# Patient Record
Sex: Female | Born: 1995 | Race: Black or African American | Hispanic: No | Smoking: Former smoker
Health system: Southern US, Community
[De-identification: ages and names within clinical notes are randomized; demographics above are authoritative.]

## PROBLEM LIST (undated history)

## (undated) ENCOUNTER — Inpatient Hospital Stay: Payer: Self-pay

## (undated) DIAGNOSIS — R000 Tachycardia, unspecified: Secondary | ICD-10-CM

## (undated) DIAGNOSIS — F419 Anxiety disorder, unspecified: Secondary | ICD-10-CM

## (undated) DIAGNOSIS — R51 Headache: Secondary | ICD-10-CM

## (undated) DIAGNOSIS — F32A Depression, unspecified: Secondary | ICD-10-CM

## (undated) DIAGNOSIS — T7840XA Allergy, unspecified, initial encounter: Secondary | ICD-10-CM

## (undated) DIAGNOSIS — F329 Major depressive disorder, single episode, unspecified: Secondary | ICD-10-CM

## (undated) DIAGNOSIS — J40 Bronchitis, not specified as acute or chronic: Secondary | ICD-10-CM

## (undated) DIAGNOSIS — J45909 Unspecified asthma, uncomplicated: Secondary | ICD-10-CM

## (undated) DIAGNOSIS — S42009A Fracture of unspecified part of unspecified clavicle, initial encounter for closed fracture: Secondary | ICD-10-CM

## (undated) HISTORY — DX: Anxiety disorder, unspecified: F41.9

## (undated) HISTORY — DX: Bronchitis, not specified as acute or chronic: J40

## (undated) HISTORY — DX: Depression, unspecified: F32.A

## (undated) HISTORY — DX: Headache: R51

## (undated) HISTORY — DX: Fracture of unspecified part of unspecified clavicle, initial encounter for closed fracture: S42.009A

## (undated) HISTORY — DX: Tachycardia, unspecified: R00.0

## (undated) HISTORY — DX: Unspecified asthma, uncomplicated: J45.909

## (undated) HISTORY — DX: Major depressive disorder, single episode, unspecified: F32.9

## (undated) HISTORY — DX: Allergy, unspecified, initial encounter: T78.40XA

## (undated) HISTORY — PX: NO PAST SURGERIES: SHX2092

---
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
## 2017-01-01 ENCOUNTER — Encounter: Payer: Medicaid Other | Admitting: Certified Nurse Midwife

## 2017-01-03 ENCOUNTER — Ambulatory Visit (INDEPENDENT_AMBULATORY_CARE_PROVIDER_SITE_OTHER): Payer: Medicaid Other | Admitting: Certified Nurse Midwife

## 2017-01-03 VITALS — BP 108/70 | HR 99 | Wt 101.0 lb

## 2017-01-03 DIAGNOSIS — Z3492 Encounter for supervision of normal pregnancy, unspecified, second trimester: Secondary | ICD-10-CM

## 2017-01-03 LAB — POCT URINALYSIS DIPSTICK
Bilirubin, UA: NEGATIVE
Blood, UA: NEGATIVE
GLUCOSE UA: NEGATIVE
Ketones, UA: NEGATIVE
LEUKOCYTES UA: NEGATIVE
NITRITE UA: NEGATIVE
SPEC GRAV UA: 1.015 (ref 1.010–1.025)
Urobilinogen, UA: 0.2 E.U./dL
pH, UA: 6 (ref 5.0–8.0)

## 2017-01-03 NOTE — Progress Notes (Signed)
ROB doing well. No complaints. She is feeling movement on occasion. Discussed anatomy u/s @ 20 wks. She verbalizes understanding. Follow up 3 wks   Doreene BurkeAnnie Loys Hoselton, CNM

## 2017-01-03 NOTE — Progress Notes (Signed)
ROB- pt is having migraines everyday, takes tylenol hasnt helped at all, pt has a history of migraines

## 2017-01-03 NOTE — Patient Instructions (Signed)

## 2017-01-05 ENCOUNTER — Encounter: Payer: Medicaid Other | Admitting: Certified Nurse Midwife

## 2017-01-09 ENCOUNTER — Other Ambulatory Visit: Payer: Self-pay | Admitting: Obstetrics and Gynecology

## 2017-01-09 DIAGNOSIS — Z369 Encounter for antenatal screening, unspecified: Secondary | ICD-10-CM

## 2017-01-21 ENCOUNTER — Encounter: Payer: Self-pay | Admitting: Certified Nurse Midwife

## 2017-01-22 ENCOUNTER — Emergency Department: Payer: Medicaid Other

## 2017-01-22 ENCOUNTER — Encounter: Payer: Self-pay | Admitting: Emergency Medicine

## 2017-01-22 ENCOUNTER — Emergency Department
Admission: EM | Admit: 2017-01-22 | Discharge: 2017-01-22 | Disposition: A | Discharge status: Home / Self Care | Payer: Medicaid Other | Attending: Emergency Medicine | Admitting: Emergency Medicine

## 2017-01-22 ENCOUNTER — Other Ambulatory Visit: Payer: Self-pay

## 2017-01-22 ENCOUNTER — Other Ambulatory Visit: Payer: Self-pay | Admitting: Certified Nurse Midwife

## 2017-01-22 DIAGNOSIS — Z79899 Other long term (current) drug therapy: Secondary | ICD-10-CM | POA: Insufficient documentation

## 2017-01-22 DIAGNOSIS — J45909 Unspecified asthma, uncomplicated: Secondary | ICD-10-CM

## 2017-01-22 DIAGNOSIS — R0602 Shortness of breath: Secondary | ICD-10-CM | POA: Diagnosis present

## 2017-01-22 DIAGNOSIS — Z87891 Personal history of nicotine dependence: Secondary | ICD-10-CM | POA: Diagnosis not present

## 2017-01-22 DIAGNOSIS — J45901 Unspecified asthma with (acute) exacerbation: Secondary | ICD-10-CM | POA: Insufficient documentation

## 2017-01-22 DIAGNOSIS — O99512 Diseases of the respiratory system complicating pregnancy, second trimester: Principal | ICD-10-CM

## 2017-01-22 MED ORDER — ALBUTEROL SULFATE (2.5 MG/3ML) 0.083% IN NEBU: 2.5000 mg | INHALATION_SOLUTION | RESPIRATORY_TRACT | 0 refills | Status: DC | PRN

## 2017-01-22 MED ORDER — ALBUTEROL SULFATE (2.5 MG/3ML) 0.083% IN NEBU
2.5000 mg | INHALATION_SOLUTION | Freq: Once | RESPIRATORY_TRACT | Status: AC
  Administered 2017-01-22: 2.5 mg via RESPIRATORY_TRACT
  Filled 2017-01-22: qty 3

## 2017-01-22 NOTE — ED Triage Notes (Signed)
Tightness in chest since yesterday, says she has asthma attack yesterday.

## 2017-01-22 NOTE — ED Notes (Signed)
See triage note states she developed some SOB and wheezing over the past 1-2 days   Min relief with inhalers
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
## 2017-01-22 NOTE — ED Notes (Signed)
FHT 164

## 2017-01-23 NOTE — L&D Delivery Note (Signed)
Delivery Note   Shelby Kelly is a 22 y.o. G1P0000 at [redacted]w[redacted]d Estimated Date of Delivery: 06/13/17  PRE-OPERATIVE DIAGNOSIS:  1) [redacted]w[redacted]d pregnancy.   POST-OPERATIVE DIAGNOSIS:  1) [redacted]w[redacted]d pregnancy s/p Vaginal, Spontaneous   Delivery Type: Vaginal, Spontaneous    Delivery Anesthesia: Epidural   Labor Complications:   none    ESTIMATED BLOOD LOSS: 250 ml    FINDINGS:   1) female infant, Apgar scores of  7 at 1 minute and 8  at 5 minutes and a birthweight of   6 lbs 13 ounces.    2) Nuchal cord: no  SPECIMENS:   PLACENTA:   Appearance: Intact , 3 vessel cord   Removal: Spontaneous      Disposition:   held per protocol the discarded  DISPOSITION:  Infant to left in stable condition in the delivery room, with L&D personnel and mother,  NARRATIVE SUMMARY: Labor course:  Ms. [name] is a G1P0000 at [redacted]w[redacted]d who presented for labor management.  She progressed well in labor with pitocin.  She received the appropriate anesthesia and proceeded to complete dilation. She evidenced good maternal expulsive effort during the second stage. She went on to deliver a viable female infant "[name]". The placenta delivered without problems and was noted to be complete. A bimanual exam was complete with removal of clots  A perineal and vaginal examination was performed. Lacerations:   None. The patient tolerated this well.  Doreene Burke, CNM  06/15/2017 5:58 PM

## 2017-01-24 ENCOUNTER — Ambulatory Visit (INDEPENDENT_AMBULATORY_CARE_PROVIDER_SITE_OTHER): Payer: Medicaid Other | Admitting: Obstetrics and Gynecology

## 2017-01-24 ENCOUNTER — Ambulatory Visit (INDEPENDENT_AMBULATORY_CARE_PROVIDER_SITE_OTHER): Payer: Medicaid Other

## 2017-01-24 VITALS — BP 96/65 | HR 106 | Wt 103.6 lb

## 2017-01-24 DIAGNOSIS — Z3402 Encounter for supervision of normal first pregnancy, second trimester: Secondary | ICD-10-CM

## 2017-01-24 DIAGNOSIS — Z23 Encounter for immunization: Secondary | ICD-10-CM

## 2017-01-24 DIAGNOSIS — O99340 Other mental disorders complicating pregnancy, unspecified trimester: Secondary | ICD-10-CM

## 2017-01-24 DIAGNOSIS — F329 Major depressive disorder, single episode, unspecified: Secondary | ICD-10-CM | POA: Insufficient documentation

## 2017-01-24 DIAGNOSIS — Z369 Encounter for antenatal screening, unspecified: Secondary | ICD-10-CM

## 2017-01-24 DIAGNOSIS — O359XX0 Maternal care for (suspected) fetal abnormality and damage, unspecified, not applicable or unspecified: Secondary | ICD-10-CM

## 2017-01-24 DIAGNOSIS — F32A Depression, unspecified: Secondary | ICD-10-CM

## 2017-01-24 DIAGNOSIS — O99519 Diseases of the respiratory system complicating pregnancy, unspecified trimester: Secondary | ICD-10-CM

## 2017-01-24 DIAGNOSIS — Z3492 Encounter for supervision of normal pregnancy, unspecified, second trimester: Secondary | ICD-10-CM

## 2017-01-24 DIAGNOSIS — J45909 Unspecified asthma, uncomplicated: Secondary | ICD-10-CM

## 2017-01-24 MED ORDER — SERTRALINE HCL 25 MG PO TABS: 25.0000 mg | ORAL_TABLET | Freq: Every day | ORAL | 6 refills | Status: DC

## 2017-01-24 NOTE — Progress Notes (Signed)
ROB- pt had anatomy scan done today, pt had a bad asthma attack went to ER

## 2017-01-24 NOTE — Patient Instructions (Signed)
Major Depressive Disorder, Adult Major depressive disorder (MDD) is a mental health condition. MDD often makes you feel sad, hopeless, or helpless. MDD can also cause symptoms in your body. MDD can affect your:  Work.  School.  Relationships.  Other normal activities.  MDD can range from mild to very bad. It may occur once (single episode MDD). It can also occur many times (recurrent MDD). The main symptoms of MDD often include:  Feeling sad, depressed, or irritable most of the time.  Loss of interest.  MDD symptoms also include:  Sleeping too much or too little.  Eating too much or too little.  A change in your weight.  Feeling tired (fatigue) or having low energy.  Feeling worthless.  Feeling guilty.  Trouble making decisions.  Trouble thinking clearly.  Thoughts of suicide or harming others.  Feeling weak.  Feeling agitated.  Keeping yourself from being around other people (isolation).  Follow these instructions at home: Activity  Do these things as told by your doctor: ? Go back to your normal activities. ? Exercise regularly. ? Spend time outdoors. Alcohol  Talk with your doctor about how alcohol can affect your antidepressant medicines.  Do not drink alcohol. Or, limit how much alcohol you drink. ? This means no more than 1 drink a day for nonpregnant women and 2 drinks a day for men. One drink equals one of these:  12 oz of beer.  5 oz of wine.  1 oz of hard liquor. General instructions  Take over-the-counter and prescription medicines only as told by your doctor.  Eat a healthy diet.  Get plenty of sleep.  Find activities that you enjoy. Make time to do them.  Think about joining a support group. Your doctor may be able to suggest a group for you.  Keep all follow-up visits as told by your doctor. This is important. Where to find more information:  The [employer] on Mental Illness: ? www.nami.org  [place] [employer] of  Mental Health: ? http://www.maynard.net/  National Suicide Prevention Lifeline: ? 4426339652. This is free, 24-hour help. Contact a doctor if:  Your symptoms get worse.  You have new symptoms. Get help right away if:  You self-harm.  You see, hear, taste, smell, or feel things that are not present (hallucinate). If you ever feel like you may hurt yourself or others, or have thoughts about taking your own life, get help right away. You can go to your nearest emergency department or call:  Your local emergency services (911 in the [place]  A suicide crisis helpline, such as the National Suicide Prevention Lifeline: ? (231)213-4207. This is open 24 hours a day.  This information is not intended to replace advice given to you by your health care provider. Make sure you discuss any questions you have with your health care provider. Document Released: 12/21/2014 Document Revised: 09/26/2015 Document Reviewed: 09/26/2015 Elsevier Interactive Patient Education  2017 Elsevier Inc. Perinatal Anxiety When a woman feels excessive tension or worry (anxiety) during pregnancy or during the first 12 months after she gives birth, she has a condition called perinatal anxiety. Anxiety can interfere with work, school, relationships, and other everyday activities. If it is not managed properly, it can also cause problems in the mother and her baby.  If you are pregnant and you have symptoms of an anxiety disorder, it is important to talk with your health care provider. What are the causes? The exact cause of this condition is not known. Hormonal changes during  and after pregnancy may play a role in causing perinatal anxiety. What increases the risk? You are more likely to develop this condition if:  You have a personal or family history of depression, anxiety, or mood disorders.  You experience a stressful life event during pregnancy, such as the death of a loved one.  You have a lot of regular life  stress, such as being a single parent.  You have thyroid problems.  What are the signs or symptoms? Perinatal anxiety can be different for everyone. It may include:  Panic attacks (panic disorder). These are intense episodes of fear or discomfort that may also cause sweating, nausea, shortness of breath, or fear of dying. They usually last 5-15 minutes.  Reliving an upsetting (traumatic) event through distressing thoughts, dreams, or flashbacks (post-traumatic stress disorder, or PTSD).  Excessive worry about multiple problems (generalized anxiety disorder).  Fear and stress about leaving certain people or loved ones (separation anxiety).  Performing repetitive tasks (compulsions) to relieve stress or worry (obsessive compulsive disorder, or OCD).  Fear of certain objects or situations (phobias).  Excessive worrying, such as a constant feeling that something bad is going to happen.  Inability to relax.  Difficulty concentrating.  Sleep problems.  Frequent nightmares or disturbing thoughts.  How is this diagnosed? This condition is diagnosed based on a physical exam and mental evaluation. In some cases, your health care provider may use an anxiety screening tool. These tools include a list of questions that can help a health care provider diagnose anxiety. Your health care provider may refer you to a mental health expert who specializes in anxiety. How is this treated? This condition may be treated with:  Medicines. Your health care provider will only give you medicines that have been proven safe for pregnancy and breastfeeding.  Talk therapy with a mental health professional to help change your patterns of thinking (cognitive behavioral therapy).  Mindfulness-based stress reduction.  Other relaxation therapies, such as deep breathing or guided muscle relaxation.  Support groups.  Follow these instructions at home: Lifestyle  Do not use any products that contain nicotine  or tobacco, such as cigarettes and e-cigarettes. If you need help quitting, ask your health care provider.  Do not use alcohol when you are pregnant. After your baby is born, limit alcohol intake to no more than 1 drink a day. One drink equals 12 oz of beer, 5 oz of wine, or 1 oz of hard liquor.  Consider joining a support group for new mothers. Ask your health care provider for recommendations.  Take good care of yourself. Make sure you: ? Get plenty of sleep. If you are having trouble sleeping, talk with your health care provider. ? Eat a healthy diet. This includes plenty of fruits and vegetables, whole grains, and lean proteins. ? Exercise regularly, as told by your health care provider. Ask your health care provider what exercises are safe for you. General instructions  Take over-the-counter and prescription medicines only as told by your health care provider.  Talk with your partner or family members about your feelings during pregnancy. Share any concerns or fears that you may have.  Ask for help with tasks or chores when you need it. Ask friends and family members to provide meals, watch your children, or help with cleaning.  Keep all follow-up visits as told by your health care provider. This is important. Contact a health care provider if:  You (or people close to you) notice that you have any  symptoms of anxiety or depression.  You have anxiety and your symptoms get worse.  You experience side effects from medicines, such as nausea or sleep problems. Get help right away if:  You feel like hurting yourself, your baby, or someone else. If you ever feel like you may hurt yourself or others, or have thoughts about taking your own life, get help right away. You can go to your nearest emergency department or call:  Your local emergency services (911 in the [place]  A suicide crisis helpline, such as the National Suicide Prevention Lifeline at 937-858-9868. This is open 24  hours a day.  Summary  Perinatal anxiety is when a woman feels excessive tension or worry during pregnancy or during the first 12 months after she gives birth.  Perinatal anxiety may include panic attacks, post-traumatic stress disorder, separation anxiety, phobias, or generalized anxiety.  Perinatal anxiety can cause physical health problems in the mother and baby if not properly managed.  This condition is treated with medicines, talk therapy, stress reduction therapies, or a combination of two or more treatments.  Talk with your partner or family members about your concerns or fears. Do not be afraid to ask for help. This information is not intended to replace advice given to you by your health care provider. Make sure you discuss any questions you have with your health care provider. Document Released: 03/08/2016 Document Revised: 03/08/2016 Document Reviewed: 03/08/2016 Elsevier Interactive Patient Education  [employer].

## 2017-01-24 NOTE — Progress Notes (Signed)
ROB & Anatomy scan:  Indications: Anatomy Findings:  Singleton intrauterine pregnancy is visualized with FHR at 143 BPM. Biometrics give an (U/S) Gestational age of 20 4/7 weeks and an (U/S) EDD of 06/16/17; this correlates with the clinically established EDD of 06/13/17.  Fetal presentation is vertex.  EFW: 301 grams (0lb 11oz). Placenta: Anterior and grade 1.  Placenta is 5.5 cm from cervical os. AFI: WNL subjectively.  Anatomic survey is complete and appears grossly WNL; Gender - Female.   Fetal stomach appears distended and contains echogenic debris.  Right Ovary was not visualized. Left Ovary measures 3.5 x 2.5 x 1.8 cm and appears WNL.  There is no obvious evidence of a corpus luteal cyst. Survey of the adnexa demonstrates no adnexal masses. There is no free peritoneal fluid in the cul de sac.  Impression: 1. 19 4/7 week Viable Singleton Intrauterine pregnancy by U/S. 2. (U/S) EDD is consistent with Clinically established (LMP) EDD of 06/13/17. 3. Anatomy scan is complete. 4. Fetal stomach appears distended and contains echogenic debris.  Recommendations: 1.Clinical correlation with the patient's History and Physical Exam.  Reviewed scan and discussed follow up with MFM. Having more asthma attacks, and they are getting more severe. Seen in ED.  Has appt with pulmonology tomorrow. Recommended flu vaccine- given today,  Also discussed depression and anxiety. Consented to start zoloft- will follow up in 2-3 weeks.  Depression screen PHQ 2/9 01/24/2017  Decreased Interest 2  Down, Depressed, Hopeless 3  PHQ - 2 Score 5  Altered sleeping 1  Tired, decreased energy 3  Change in appetite 1  Feeling bad or failure about yourself  1  Trouble concentrating 1  Moving slowly or fidgety/restless 0  Suicidal thoughts 0  PHQ-9 Score 12  Difficult doing work/chores Somewhat difficult

## 2017-01-24 NOTE — Addendum Note (Signed)
Addended by: Rosine BeatLONTZ, AMY L on: 01/24/2017 04:36 PM   Modules accepted: Orders

## 2017-01-25 ENCOUNTER — Encounter: Payer: Self-pay | Admitting: Internal Medicine

## 2017-01-25 ENCOUNTER — Ambulatory Visit (INDEPENDENT_AMBULATORY_CARE_PROVIDER_SITE_OTHER): Payer: Medicaid Other | Admitting: Internal Medicine

## 2017-01-25 VITALS — BP 90/68 | HR 108 | Resp 16 | Ht 59.0 in | Wt 102.0 lb

## 2017-01-25 DIAGNOSIS — J45909 Unspecified asthma, uncomplicated: Secondary | ICD-10-CM | POA: Diagnosis not present

## 2017-01-25 DIAGNOSIS — O99519 Diseases of the respiratory system complicating pregnancy, unspecified trimester: Secondary | ICD-10-CM | POA: Diagnosis not present

## 2017-01-25 NOTE — Patient Instructions (Signed)
Continue to use your current inhaler.   Use flutter valve after using your inhaler.   If you feel a flare up or a panic attack, use your flutter valve 5 times and repeat as needed. If this does not help then you should call us or go to the emergency room.
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
## 2017-01-30 ENCOUNTER — Other Ambulatory Visit: Payer: Self-pay | Admitting: Obstetrics and Gynecology

## 2017-01-30 DIAGNOSIS — Z3689 Encounter for other specified antenatal screening: Secondary | ICD-10-CM

## 2017-02-07 ENCOUNTER — Encounter: Payer: Self-pay | Admitting: Certified Nurse Midwife

## 2017-02-09 ENCOUNTER — Encounter: Payer: Medicaid Other | Admitting: Certified Nurse Midwife

## 2017-02-13 ENCOUNTER — Encounter: Payer: Self-pay | Admitting: Certified Nurse Midwife

## 2017-02-13 NOTE — Telephone Encounter (Signed)
Called pt to r/s her appt. Unable to leave message voicemail is not set up .

## 2017-02-15 ENCOUNTER — Ambulatory Visit
Admission: RE | Admit: 2017-02-15 | Discharge: 2017-02-15 | Disposition: A | Discharge status: Home / Self Care | Payer: Medicaid Other | Source: Ambulatory Visit | Attending: Maternal & Fetal Medicine | Admitting: Maternal & Fetal Medicine

## 2017-02-15 ENCOUNTER — Ambulatory Visit: Payer: Medicaid Other

## 2017-02-15 DIAGNOSIS — Z3689 Encounter for other specified antenatal screening: Secondary | ICD-10-CM

## 2017-02-15 DIAGNOSIS — O09899 Supervision of other high risk pregnancies, unspecified trimester: Secondary | ICD-10-CM

## 2017-02-15 DIAGNOSIS — D582 Other hemoglobinopathies: Secondary | ICD-10-CM

## 2017-02-15 DIAGNOSIS — Z283 Underimmunization status: Secondary | ICD-10-CM

## 2017-02-15 HISTORY — DX: Tachycardia, unspecified: R00.0

## 2017-02-19 ENCOUNTER — Ambulatory Visit (INDEPENDENT_AMBULATORY_CARE_PROVIDER_SITE_OTHER): Payer: Medicaid Other | Admitting: Certified Nurse Midwife

## 2017-02-19 ENCOUNTER — Encounter: Payer: Self-pay | Admitting: Certified Nurse Midwife

## 2017-02-19 VITALS — BP 103/56 | HR 98 | Wt 104.1 lb

## 2017-02-19 DIAGNOSIS — Z3A28 28 weeks gestation of pregnancy: Secondary | ICD-10-CM

## 2017-02-19 DIAGNOSIS — Z3492 Encounter for supervision of normal pregnancy, unspecified, second trimester: Secondary | ICD-10-CM

## 2017-02-19 LAB — POCT URINALYSIS DIPSTICK
Bilirubin, UA: NEGATIVE
GLUCOSE UA: NEGATIVE
KETONES UA: NEGATIVE
Leukocytes, UA: NEGATIVE
Nitrite, UA: NEGATIVE
Protein, UA: NEGATIVE
RBC UA: NEGATIVE
SPEC GRAV UA: 1.025 (ref 1.010–1.025)
Urobilinogen, UA: 0.2 E.U./dL
pH, UA: 5 (ref 5.0–8.0)

## 2017-02-19 NOTE — Patient Instructions (Signed)

## 2017-02-19 NOTE — Progress Notes (Signed)
ROB, doing well. Had mfm consult. "The fetal anatomical survey appears within normal limits within the resolution of ultrasound as described above.The stomach and bowel appear normal today". No further follow up or reccomendations documented. Will scan into patients chart. Anticipatory guidance given for 28 wks. Visit. She verbalizes understanding and agrees to plan. Follow up 4 wks.   Shelby BurkeAnnie Haylie Kelly, CNM

## 2017-02-19 NOTE — Progress Notes (Signed)
Pt is here for an ROB visit. No c/o. 

## 2017-02-19 NOTE — Addendum Note (Signed)
Addended by: Brooke DareSICK, Sayed Apostol L on: 02/19/2017 03:14 PM   Modules accepted: Orders

## 2017-03-01 ENCOUNTER — Encounter: Payer: Self-pay | Admitting: Certified Nurse Midwife

## 2017-03-07 ENCOUNTER — Other Ambulatory Visit: Payer: Self-pay | Admitting: Certified Nurse Midwife

## 2017-03-08 ENCOUNTER — Ambulatory Visit: Payer: Medicaid Other | Admitting: Internal Medicine

## 2017-03-08 NOTE — Progress Notes (Deleted)
[facility] Medicine Consultation      Assessment and Plan:  Pregnancy exacerbated asthma. -Pregnancy appears to have exacerbated in the second trimester. - Patient is currently on mometasone and albuterol which give her no relief. -I suspect that some of her symptoms are due to anxiety/panic which makes her breathing worse. -Continue current inhalers.  Breathing does not improve can consider starting Singulair which is pregnancy class B.  Anxiety/panic disorder. -Recommended using relaxation techniques during flareups. -I have also given her a flutter valve, this is partly to help her to concentrate on deep, slow breathing when her breathing gets worse.  No Follow-up on file.    Date: 03/08/2017  MRN# [id number] Shelby Kelly October 06, 1995    Shelby Kelly is a 22 y.o. old female seen in consultation for chief complaint of:    No chief complaint on file.   HPI:  Patient is a 22 year old female with pregnancy exacerbated asthma, coming on in her second trimester.  She was also noted to have anxiety and panic symptoms which are making her breathing worse during episodes.  Last visit we decided to continue her on mometasone and albuterol and will consider adding Singulair if she did not have further relief.  I also given her a flutter valve, partially to help her concentrate on slow deep breathing during episodes when she felt like her breathing was worse.  She was noted to be living at home with 2 dogs and a cat when noted to have occasional allergies, with mild reflux.   CXR 01/22/17; normal lungs.   Social Hx:   Social History   Tobacco Use  . Smoking status: Former Smoker    Types: Cigarettes  . Smokeless tobacco: Never Used  . Tobacco comment: stopped once she found out she was pregnant  Substance Use Topics  . Alcohol use: No  . Drug use: No   Medication:    Current Outpatient Medications:  .  albuterol (PROVENTIL) (2.5 MG/3ML) 0.083% nebulizer  solution, Take 3 mLs (2.5 mg total) by nebulization every 4 (four) hours as needed for wheezing or shortness of breath., Disp: 75 mL, Rfl: 0 .  metroNIDAZOLE (METROGEL VAGINAL) 0.75 % vaginal gel, Place 1 Applicatorful at bedtime vaginally. For 5 nights (Patient not taking: Reported on 01/03/2017), Disp: 70 g, Rfl: 0 .  Mometasone Furoate (ASMANEX HFA) 100 MCG/ACT AERO, Inhale 2 puffs into the lungs every 12 (twelve) hours., Disp: , Rfl:  .  ondansetron (ZOFRAN ODT) 4 MG disintegrating tablet, Take 1 tablet (4 mg total) every 6 (six) hours as needed by mouth for nausea. (Patient not taking: Reported on 01/03/2017), Disp: 20 tablet, Rfl: 0 .  PROAIR HFA 108 (90 Base) MCG/ACT inhaler, INHALE 2 PUFFS Q 4 - 6 H PRN, Disp: , Rfl: 0 .  sertraline (ZOLOFT) 25 MG tablet, Take 1 tablet (25 mg total) by mouth daily., Disp: 30 tablet, Rfl: 6   Allergies:  Aspergillus (mixed) allergy skin test  Review of Systems: Gen:  Denies  fever, sweats, chills HEENT: Denies blurred vision, double vision. bleeds, sore throat Cvc:  No dizziness, chest pain. Resp:   Denies cough or sputum production, shortness of breath Gi: Denies swallowing difficulty, stomach pain. Gu:  Denies bladder incontinence, burning urine Ext:   No Joint pain, stiffness. Skin: No skin rash,  hives  Endoc:  No polyuria, polydipsia. Psych: No depression, insomnia. Other:  All other systems were reviewed with the patient and were negative other that what is mentioned  in the HPI.   Physical Examination:   VS: LMP 09/06/2016 (Approximate)   General Appearance: No distress  Neuro:without focal findings,  speech normal,  HEENT: PERRLA, EOM intact.   Pulmonary: normal breath sounds, No wheezing.  CardiovascularNormal S1,S2.  No m/r/g.   Abdomen: Benign, Soft, non-tender. Renal:  No costovertebral tenderness  GU:  No performed at this time. Endoc: No evident thyromegaly, no signs of acromegaly. Skin:   warm, no rashes, no ecchymosis    Extremities: normal, no cyanosis, clubbing.  Other findings:    LABORATORY PANEL:   CBC No results for input(s): WBC, HGB, HCT, PLT in the last 168 hours. ------------------------------------------------------------------------------------------------------------------  Chemistries  No results for input(s): NA, K, CL, CO2, GLUCOSE, BUN, CREATININE, CALCIUM, MG, AST, ALT, ALKPHOS, BILITOT in the last 168 hours.  Invalid input(s): GFRCGP ------------------------------------------------------------------------------------------------------------------  Cardiac Enzymes No results for input(s): TROPONINI in the last 168 hours. ------------------------------------------------------------  RADIOLOGY:  No results found.     Thank  you for the consultation and for [facility] to assist in the care of your patient. Our recommendations are noted above.  Please contact us if we can be of further service.   Wells Guileseep Zaria Taha, MD.  Board Certified in Internal Medicine, Pulmonary Medicine, Critical Care Medicine, and Sleep Medicine.  [place] [facility] Office Number: [phone]  Santiago Gladavid Kasa, M.D.  Billy Fischeravid Simonds, M.D  03/08/2017

## 2017-03-09 ENCOUNTER — Encounter: Payer: Self-pay | Admitting: Internal Medicine

## 2017-03-12 ENCOUNTER — Encounter: Payer: Self-pay | Admitting: Certified Nurse Midwife

## 2017-03-12 ENCOUNTER — Ambulatory Visit (INDEPENDENT_AMBULATORY_CARE_PROVIDER_SITE_OTHER): Payer: Medicaid Other | Admitting: Certified Nurse Midwife

## 2017-03-12 VITALS — BP 101/57 | HR 92 | Wt 107.2 lb

## 2017-03-12 DIAGNOSIS — Z3403 Encounter for supervision of normal first pregnancy, third trimester: Secondary | ICD-10-CM | POA: Diagnosis not present

## 2017-03-12 LAB — POCT URINALYSIS DIPSTICK
Bilirubin, UA: NEGATIVE
Blood, UA: NEGATIVE
Glucose, UA: NEGATIVE
KETONES UA: NEGATIVE
LEUKOCYTES UA: NEGATIVE
NITRITE UA: NEGATIVE
PH UA: 7 (ref 5.0–8.0)
PROTEIN UA: NEGATIVE
SPEC GRAV UA: 1.01 (ref 1.010–1.025)
UROBILINOGEN UA: 0.2 U/dL

## 2017-03-12 NOTE — Progress Notes (Signed)
Pt is here with c/o right side back,hip and leg pain. Aslo c/o cold symptoms-non productive cough and chest hurts.Tried Robitussin with no relief. Denies fever.

## 2017-03-12 NOTE — Progress Notes (Signed)
ROB, doing well. Complains of back and hip pain. Encouraged use of belly band and tylenol. Also suggested chiropractor. She verbalized understanding. ROB 1 wks for 1hr GTT.   Shelby BurkeAnnie Roddrick Kelly, CNM

## 2017-03-12 NOTE — Patient Instructions (Signed)

## 2017-03-20 ENCOUNTER — Other Ambulatory Visit: Payer: Medicaid Other

## 2017-03-20 ENCOUNTER — Other Ambulatory Visit: Payer: Self-pay

## 2017-03-20 ENCOUNTER — Ambulatory Visit (INDEPENDENT_AMBULATORY_CARE_PROVIDER_SITE_OTHER): Payer: Medicaid Other | Admitting: Certified Nurse Midwife

## 2017-03-20 VITALS — BP 102/60 | HR 106 | Wt 108.1 lb

## 2017-03-20 DIAGNOSIS — Z3403 Encounter for supervision of normal first pregnancy, third trimester: Secondary | ICD-10-CM

## 2017-03-20 DIAGNOSIS — Z23 Encounter for immunization: Secondary | ICD-10-CM

## 2017-03-20 DIAGNOSIS — Z3A28 28 weeks gestation of pregnancy: Secondary | ICD-10-CM

## 2017-03-20 LAB — POCT URINALYSIS DIPSTICK
BILIRUBIN UA: NEGATIVE
Glucose, UA: NEGATIVE
KETONES UA: NEGATIVE
Leukocytes, UA: NEGATIVE
Nitrite, UA: NEGATIVE
PH UA: 7.5 (ref 5.0–8.0)
Protein, UA: NEGATIVE
RBC UA: NEGATIVE
SPEC GRAV UA: 1.02 (ref 1.010–1.025)
UROBILINOGEN UA: 0.2 U/dL

## 2017-03-20 NOTE — Progress Notes (Signed)
ROB-Doing well. 28 week labs this visit. Education intrapartum pain management options and cord blood banking. Handouts: childbirth class schedule, [facility] Development worker, international aidVolunteer Doula, and  Pediatrician list. FOB, mother and grandmother as labor support. Plans breastfeeding and pill as contraception. TDaP today. Reviewed red flag symptoms and when to call. RTC x 2 weeks for ROB or sooner if needed.   [facility] 03/20/2017 01/24/2017  Decreased Interest 3 2  Down, Depressed, Hopeless 1 3  PHQ - 2 Score 4 5  Altered sleeping 0 1  Tired, decreased energy 3 3  Change in appetite 0 1  Feeling bad or failure about yourself  1 1  Trouble concentrating 0 1  Moving slowly or fidgety/restless 0 0  Suicidal thoughts 0 0  PHQ-9 Score 8 12  Difficult doing work/chores - Somewhat difficult

## 2017-03-20 NOTE — Patient Instructions (Signed)
Abdominal Pain During Pregnancy Belly (abdominal) pain is common during pregnancy. Most of the time, it is not a serious problem. Other times, it can be a sign that something is wrong with the pregnancy. Always tell your doctor if you have belly pain. Follow these instructions at home: Monitor your belly pain for any changes. The following actions may help you feel better:  Do not have sex (intercourse) or put anything in your vagina until you feel better.  Rest until your pain stops.  Drink clear fluids if you feel sick to your stomach (nauseous). Do not eat solid food until you feel better.  Only take medicine as told by your doctor.  Keep all doctor visits as told.  Get help right away if:  You are bleeding, leaking fluid, or pieces of tissue come out of your vagina.  You have more pain or cramping.  You keep throwing up (vomiting).  You have pain when you pee (urinate) or have blood in your pee.  You have a fever.  You do not feel your baby moving as much.  You feel very weak or feel like passing out.  You have trouble breathing, with or without belly pain.  You have a very bad headache and belly pain.  You have fluid leaking from your vagina and belly pain.  You keep having watery poop (diarrhea).  Your belly pain does not go away after resting, or the pain gets worse. This information is not intended to replace advice given to you by your health care provider. Make sure you discuss any questions you have with your health care provider. Document Released: 12/28/2008 Document Revised: 08/18/2015 Document Reviewed: 08/08/2012 Elsevier Interactive Patient Education  2018 [employer]. Back Pain in Pregnancy Back pain during pregnancy is common. Back pain may be caused by several factors that are related to changes during your pregnancy. Follow these instructions at home: Managing pain, stiffness, and swelling  If directed, apply ice for sudden (acute) back  pain. ? Put ice in a plastic bag. ? Place a towel between your skin and the bag. ? Leave the ice on for 20 minutes, 2-3 times per day.  If directed, apply heat to the affected area before you exercise: ? Place a towel between your skin and the heat pack or heating pad. ? Leave the heat on for 20-30 minutes. ? Remove the heat if your skin turns bright red. This is especially important if you are unable to feel pain, heat, or cold. You may have a greater risk of getting burned. Activity  Exercise as told by your health care provider. Exercising is the best way to prevent or manage back pain.  Listen to your body when lifting. If lifting hurts, ask for help or bend your knees. This uses your leg muscles instead of your back muscles.  Squat down when picking up something from the floor. Do not bend over.  Only use bed rest as told by your health care provider. Bed rest should only be used for the most severe episodes of back pain. Standing, Sitting, and Lying Down  Do not stand in one place for long periods of time.  Use good posture when sitting. Make sure your head rests over your shoulders and is not hanging forward. Use a pillow on your lower back if necessary.  Try sleeping on your side, preferably the left side, with a pillow or two between your legs. If you are sore after a night's rest, your bed may  be too soft. A firm mattress may provide more support for your back during pregnancy. General instructions  Do not wear high heels.  Eat a healthy diet. Try to gain weight within your health care provider's recommendations.  Use a maternity girdle, elastic sling, or back brace as told by your health care provider.  Take over-the-counter and prescription medicines only as told by your health care provider.  Keep all follow-up visits as told by your health care provider. This is important. This includes any visits with any specialists, such as a physical therapist. Contact a health  care provider if:  Your back pain interferes with your daily activities.  You have increasing pain in other parts of your body. Get help right away if:  You develop numbness, tingling, weakness, or problems with the use of your arms or legs.  You develop severe back pain that is not controlled with medicine.  You have a sudden change in bowel or bladder control.  You develop shortness of breath, dizziness, or you faint.  You develop nausea, vomiting, or sweating.  You have back pain that is a rhythmic, cramping pain similar to labor pains. Labor pain is usually 1-2 minutes apart, lasts for about 1 minute, and involves a bearing down feeling or pressure in your pelvis.  You have back pain and your water breaks or you have vaginal bleeding.  You have back pain or numbness that travels down your leg.  Your back pain developed after you fell.  You develop pain on one side of your back.  You see blood in your urine.  You develop skin blisters in the area of your back pain. This information is not intended to replace advice given to you by your health care provider. Make sure you discuss any questions you have with your health care provider. Document Released: 04/19/2005 Document Revised: 06/17/2015 Document Reviewed: 09/23/2014 Elsevier Interactive Patient Education  2018 [employer]. [facility]  [place], [place], [place] [address]  Phone: [phone]   [facility] (second location)  [address] [place], [place] [address]  Phone: [phone]   [facility]) [place], [place], [place] [address] Phone: [phone]   [facility] [address] [place], [place] [address]  Phone: [phone] Relief During Labor and Delivery Many things can cause pain during labor and delivery, including:  Pressure on bones and ligaments due to the baby moving through the  pelvis.  Stretching of tissues due to the baby moving through the birth canal.  Muscle tension due to anxiety or nervousness.  The uterus tightening (contracting) and relaxing to help move the baby.  There are many ways to deal with the pain of labor and delivery. They include:  Taking prenatal classes. Taking these classes helps you know what to expect during your baby's birth. What you learn will increase your confidence and decrease your anxiety.  Practicing relaxation techniques or doing relaxing activities, such as: ? Focused breathing. ? Meditation. ? Visualization. ? Aroma therapy. ? Listening to your favorite music. ? Hypnosis.  Taking a warm shower or bath (hydrotherapy). This may: ? Provide comfort and relaxation. ? Lessen your perception of pain. ? Decrease the amount of pain medicine needed. ? Decrease the length of labor.  Getting a massage or counterpressure on your back.  Applying warm packs or ice packs.  Changing positions often, moving around, or using a birthing ball.  Getting: ?  Pain medicine through an IV or injection into a muscle. ? Pain medicine inserted into your spinal column. ? Injections of sterile water just under the skin on your lower back (intradermal injections). ? Laughing gas (nitrous oxide).  Discuss your pain control options with your health care provider during your prenatal visits. Explore the options offered by your hospital or birth center. What kinds of medicine are available? There are two kinds of medicines that can be used to relieve pain during labor and delivery:  Analgesics. These medicines decrease pain without causing you to lose feeling or the ability to move your muscles.  Anesthetics. These medicines block feeling in the body and can decrease your ability to move freely.  Both of these kinds of medicine can cause minor side effects, such as nausea, trouble concentrating, and sleepiness. They can also decrease the  baby's heart rate before birth and affect the baby's breathing rate after birth. For this reason, health care providers are careful about when and how much medicine is given. What are specific medicines and procedures that provide pain relief? Local Anesthetics Local anesthetics are used to numb a small area of the body. They may be used along with another kind of anesthetic or used to numb the nerves of the vagina, cervix, and perineum during the second stage of labor. General Anesthetics General anesthetics cause you to lose consciousness so you do not feel pain. They are usually only used for an emergency cesarean delivery. General anesthetics are given through an IV tube and a mask. Pudendal Block A pudendal block is a form of local anesthetic. It may be used to relieve the pain associated with pushing or stretching of the perineum at the time of delivery or to further numb the perineum. A pudendal block is done by injecting numbing medicine through the vaginal wall into a nerve in the pelvis. Epidural Analgesia Epidural analgesia is given through a flexible IV catheter that is inserted into the lower back. Numbing medicine is delivered continuously to the area near your spinal column nerves (epidural space). After having this type of analgesia, you may be able to move your legs but you most likely will not be able to walk. Depending on the amount of medicine given, you may lose all feeling in the lower half of your body, or you may retain some level of sensation, including the urge to push. Epidural analgesia can be used to provide pain relief for a vaginal birth. Spinal Block A spinal block is similar to epidural analgesia, but the medicine is injected into the spinal fluid instead of the epidural space. A spinal block is only given once. It starts to relieve pain quickly, but the pain relief lasts only 1-6 hours. Spinal blocks can be used for cesarean deliveries. Combined Spinal-Epidural (CSE)  Block A CSE block combines the effects of a spinal block and epidural analgesia. The spinal block works quickly to block all pain. The epidural analgesia provides continuous pain relief, even after the effects of the spinal block have worn off. This information is not intended to replace advice given to you by your health care provider. Make sure you discuss any questions you have with your health care provider. Document Released: 04/27/2008 Document Revised: 06/18/2015 Document Reviewed: 06/02/2015 Elsevier Interactive Patient Education  2018 [employer]. Breastfeeding Choosing to breastfeed is one of the best decisions you can make for yourself and your baby. A change in hormones during pregnancy causes your breasts to make breast milk in your  milk-producing glands. Hormones prevent breast milk from being released before your baby is born. They also prompt milk flow after birth. Once breastfeeding has begun, thoughts of your baby, as well as his or her sucking or crying, can stimulate the release of milk from your milk-producing glands. Benefits of breastfeeding Research shows that breastfeeding offers many health benefits for infants and mothers. It also offers a cost-free and convenient way to feed your baby. For your baby  Your first milk (colostrum) helps your baby's digestive system to function better.  Special cells in your milk (antibodies) help your baby to fight off infections.  Breastfed babies are less likely to develop asthma, allergies, obesity, or type 2 diabetes. They are also at lower risk for sudden infant death syndrome (SIDS).  Nutrients in breast milk are better able to meet your baby's needs compared to infant formula.  Breast milk improves your baby's brain development. For you  Breastfeeding helps to create a very special bond between you and your baby.  Breastfeeding is convenient. Breast milk costs nothing and is always available at the correct  temperature.  Breastfeeding helps to burn calories. It helps you to lose the weight that you gained during pregnancy.  Breastfeeding makes your uterus return faster to its size before pregnancy. It also slows bleeding (lochia) after you give birth.  Breastfeeding helps to lower your risk of developing type 2 diabetes, osteoporosis, rheumatoid arthritis, cardiovascular disease, and breast, ovarian, uterine, and endometrial cancer later in life. Breastfeeding basics Starting breastfeeding  Find a comfortable place to sit or lie down, with your neck and back well-supported.  Place a pillow or a rolled-up blanket under your baby to bring him or her to the level of your breast (if you are seated). Nursing pillows are specially designed to help support your arms and your baby while you breastfeed.  Make sure that your baby's tummy (abdomen) is facing your abdomen.  Gently massage your breast. With your fingertips, massage from the outer edges of your breast inward toward the nipple. This encourages milk flow. If your milk flows slowly, you may need to continue this action during the feeding.  Support your breast with 4 fingers underneath and your thumb above your nipple (make the letter "C" with your hand). Make sure your fingers are well away from your nipple and your baby's mouth.  Stroke your baby's lips gently with your finger or nipple.  When your baby's mouth is open wide enough, quickly bring your baby to your breast, placing your entire nipple and as much of the areola as possible into your baby's mouth. The areola is the colored area around your nipple. ? More areola should be visible above your baby's upper lip than below the lower lip. ? Your baby's lips should be opened and extended outward (flanged) to ensure an adequate, comfortable latch. ? Your baby's tongue should be between his or her lower gum and your breast.  Make sure that your baby's mouth is correctly positioned around  your nipple (latched). Your baby's lips should create a seal on your breast and be turned out (everted).  It is common for your baby to suck about 2-3 minutes in order to start the flow of breast milk. Latching Teaching your baby how to latch onto your breast properly is very important. An improper latch can cause nipple pain, decreased milk supply, and poor weight gain in your baby. Also, if your baby is not latched onto your nipple properly, he or she  may swallow some air during feeding. This can make your baby fussy. Burping your baby when you switch breasts during the feeding can help to get rid of the air. However, teaching your baby to latch on properly is still the best way to prevent fussiness from swallowing air while breastfeeding. Signs that your baby has successfully latched onto your nipple  Silent tugging or silent sucking, without causing you pain. Infant's lips should be extended outward (flanged).  Swallowing heard between every 3-4 sucks once your milk has started to flow (after your let-down milk reflex occurs).  Muscle movement above and in front of his or her ears while sucking.  Signs that your baby has not successfully latched onto your nipple  Sucking sounds or smacking sounds from your baby while breastfeeding.  Nipple pain.  If you think your baby has not latched on correctly, slip your finger into the corner of your baby's mouth to break the suction and place it between your baby's gums. Attempt to start breastfeeding again. Signs of successful breastfeeding Signs from your baby  Your baby will gradually decrease the number of sucks or will completely stop sucking.  Your baby will fall asleep.  Your baby's body will relax.  Your baby will retain a small amount of milk in his or her mouth.  Your baby will let go of your breast by himself or herself.  Signs from you  Breasts that have increased in firmness, weight, and size 1-3 hours after  feeding.  Breasts that are softer immediately after breastfeeding.  Increased milk volume, as well as a change in milk consistency and color by the fifth day of breastfeeding.  Nipples that are not sore, cracked, or bleeding.  Signs that your baby is getting enough milk  Wetting at least 1-2 diapers during the first 24 hours after birth.  Wetting at least 5-6 diapers every 24 hours for the first week after birth. The urine should be clear or pale yellow by the age of 5 days.  Wetting 6-8 diapers every 24 hours as your baby continues to grow and develop.  At least 3 stools in a 24-hour period by the age of 5 days. The stool should be soft and yellow.  At least 3 stools in a 24-hour period by the age of 7 days. The stool should be seedy and yellow.  No loss of weight greater than 10% of birth weight during the first 3 days of life.  Average weight gain of 4-7 oz (113-198 g) per week after the age of 4 days.  Consistent daily weight gain by the age of 5 days, without weight loss after the age of 2 weeks. After a feeding, your baby may spit up a small amount of milk. This is normal. Breastfeeding frequency and duration Frequent feeding will help you make more milk and can prevent sore nipples and extremely full breasts (breast engorgement). Breastfeed when you feel the need to reduce the fullness of your breasts or when your baby shows signs of hunger. This is called "breastfeeding on demand." Signs that your baby is hungry include:  Increased alertness, activity, or restlessness.  Movement of the head from side to side.  Opening of the mouth when the corner of the mouth or cheek is stroked (rooting).  Increased sucking sounds, smacking lips, cooing, sighing, or squeaking.  Hand-to-mouth movements and sucking on fingers or hands.  Fussing or crying.  Avoid introducing a pacifier to your baby in the first 4-6 weeks after  your baby is born. After this time, you may choose to use a  pacifier. Research has shown that pacifier use during the first year of a baby's life decreases the risk of sudden infant death syndrome (SIDS). Allow your baby to feed on each breast as long as he or she wants. When your baby unlatches or falls asleep while feeding from the first breast, offer the second breast. Because newborns are often sleepy in the first few weeks of life, you may need to awaken your baby to get him or her to feed. Breastfeeding times will vary from baby to baby. However, the following rules can serve as a guide to help you make sure that your baby is properly fed:  Newborns (babies 78 weeks of age or younger) may breastfeed every 1-3 hours.  Newborns should not go without breastfeeding for longer than 3 hours during the day or 5 hours during the night.  You should breastfeed your baby a minimum of 8 times in a 24-hour period.  Breast milk pumping Pumping and storing breast milk allows you to make sure that your baby is exclusively fed your breast milk, even at times when you are unable to breastfeed. This is especially important if you go back to work while you are still breastfeeding, or if you are not able to be present during feedings. Your lactation consultant can help you find a method of pumping that works best for you and give you guidelines about how long it is safe to store breast milk. Caring for your breasts while you breastfeed Nipples can become dry, cracked, and sore while breastfeeding. The following recommendations can help keep your breasts moisturized and healthy:  Avoid using soap on your nipples.  Wear a supportive bra designed especially for nursing. Avoid wearing underwire-style bras or extremely tight bras (sports bras).  Air-dry your nipples for 3-4 minutes after each feeding.  Use only cotton bra pads to absorb leaked breast milk. Leaking of breast milk between feedings is normal.  Use lanolin on your nipples after breastfeeding. Lanolin helps to  maintain your skin's normal moisture barrier. Pure lanolin is not harmful (not toxic) to your baby. You may also hand express a few drops of breast milk and gently massage that milk into your nipples and allow the milk to air-dry.  In the first few weeks after giving birth, some women experience breast engorgement. Engorgement can make your breasts feel heavy, warm, and tender to the touch. Engorgement peaks within 3-5 days after you give birth. The following recommendations can help to ease engorgement:  Completely empty your breasts while breastfeeding or pumping. You may want to start by applying warm, moist heat (in the shower or with warm, water-soaked hand towels) just before feeding or pumping. This increases circulation and helps the milk flow. If your baby does not completely empty your breasts while breastfeeding, pump any extra milk after he or she is finished.  Apply ice packs to your breasts immediately after breastfeeding or pumping, unless this is too uncomfortable for you. To do this: ? Put ice in a plastic bag. ? Place a towel between your skin and the bag. ? Leave the ice on for 20 minutes, 2-3 times a day.  Make sure that your baby is latched on and positioned properly while breastfeeding.  If engorgement persists after 48 hours of following these recommendations, contact your health care provider or a Science writer. Overall health care recommendations while breastfeeding  Eat 3 healthy meals and  3 snacks every day. Well-nourished mothers who are breastfeeding need an additional 450-500 calories a day. You can meet this requirement by increasing the amount of a balanced diet that you eat.  Drink enough water to keep your urine pale yellow or clear.  Rest often, relax, and continue to take your prenatal vitamins to prevent fatigue, stress, and low vitamin and mineral levels in your body (nutrient deficiencies).  Do not use any products that contain nicotine or tobacco,  such as cigarettes and e-cigarettes. Your baby may be harmed by chemicals from cigarettes that pass into breast milk and exposure to secondhand smoke. If you need help quitting, ask your health care provider.  Avoid alcohol.  Do not use illegal drugs or marijuana.  Talk with your health care provider before taking any medicines. These include over-the-counter and prescription medicines as well as vitamins and herbal supplements. Some medicines that may be harmful to your baby can pass through breast milk.  It is possible to become pregnant while breastfeeding. If birth control is desired, ask your health care provider about options that will be safe while breastfeeding your baby. Where to find more information: [employer]: www.llli.org Contact a health care provider if:  You feel like you want to stop breastfeeding or have become frustrated with breastfeeding.  Your nipples are cracked or bleeding.  Your breasts are red, tender, or warm.  You have: ? Painful breasts or nipples. ? A swollen area on either breast. ? A fever or chills. ? Nausea or vomiting. ? Drainage other than breast milk from your nipples.  Your breasts do not become full before feedings by the fifth day after you give birth.  You feel sad and depressed.  Your baby is: ? Too sleepy to eat well. ? Having trouble sleeping. ? More than 37 week old and wetting fewer than 6 diapers in a 24-hour period. ? Not gaining weight by 90 days of age.  Your baby has fewer than 3 stools in a 24-hour period.  Your baby's skin or the white parts of his or her eyes become yellow. Get help right away if:  Your baby is overly tired (lethargic) and does not want to wake up and feed.  Your baby develops an unexplained fever. Summary  Breastfeeding offers many health benefits for infant and mothers.  Try to breastfeed your infant when he or she shows early signs of hunger.  Gently tickle or stroke your  baby's lips with your finger or nipple to allow the baby to open his or her mouth. Bring the baby to your breast. Make sure that much of the areola is in your baby's mouth. Offer one side and burp the baby before you offer the other side.  Talk with your health care provider or lactation consultant if you have questions or you face problems as you breastfeed. This information is not intended to replace advice given to you by your health care provider. Make sure you discuss any questions you have with your health care provider. Document Released: 01/09/2005 Document Revised: 02/11/2016 Document Reviewed: 02/11/2016 Elsevier Interactive Patient Education  2018 [employer]. Cord Blood Banking Information Cord blood banking is the process of collecting and storing the blood that is in the umbilical cord and placenta at the time of delivery. This blood contains stem cells, which can be used to treat many blood diseases, immune system disorders, and childhood cancers. Stem cells can also be used to research certain diseases and treatments. Many  people who choose cord blood banking donate the blood. Donated blood can be used in lifesaving treatments or for research. Other people choose to store the blood privately. Blood that is stored privately can only be used with the person's permission. This option is often chosen if:  A family member needs a stem cell transplant.  The child is part of an ethnic minority.  The child was conceived through in vitro fertilization.  What should I look for in a blood bank? A blood bank is the organization that coordinates cord blood banking. Make sure the cord blood bank that you use:  Is accredited.  Is financially stable.  Handles a large volume of cord blood samples.  Has a procedure in place for transport and storage.  Allows you the option of transferring your cord blood sample.  Has a procedure in place if the bank goes out of business.  Clearly  states all costs and limits to future costs.  People who choose to donate cord blood should not need to pay for blood banking. People who keep the blood for private use will need to pay for the first (initial) storage and pay a fee each year (annual fee). Other fees may also apply. What are the risks of cord blood banking? There are no health risks associated with cord blood banking. It is considered safe. How should I prepare? You must schedule this process at least 4-6 weeks before you will be giving birth. How is the blood collected? The blood is collected as soon as the baby has been delivered. Within 15 minutes of delivery, a health care provider will take these actions to collect the blood:  Clamp the umbilical cord at the top and bottom. This traps the blood in the umbilical cord.  Use a syringe or bag to collect the blood.  Insert needles into the placenta to collect (draw out) more blood.  What happens after the blood is collected? After the blood has been collected:  The blood will be sent to a blood bank.  The blood will be tested for genetic problems and infectious diseases. If the blood tests positive for a genetic problem or a disease, someone will contact you and let you know.  The blood will be frozen.  If your child develops a genetic condition, immune system disorder, or cancer, you will be responsible for contacting the blood bank and letting them know. This information is not intended to replace advice given to you by your health care provider. Make sure you discuss any questions you have with your health care provider. Document Released: 06/29/2009 Document Revised: 06/17/2015 Document Reviewed: 06/29/2014 Elsevier Interactive Patient Education  2018 [employer]. Common Medications Safe in Pregnancy  Acne:      Constipation:  Benzoyl Peroxide     Colace  Clindamycin      Dulcolax Suppository  Topica Erythromycin     Fibercon  Salicylic  Acid      Metamucil         Miralax AVOID:        Senakot   Accutane    Cough:  Retin-A       Cough Drops  Tetracycline      Phenergan w/ Codeine if Rx  Minocycline      Robitussin (Plain & DM)  Antibiotics:     Crabs/Lice:  Ceclor       RID  Cephalosporins    AVOID:  E-Mycins      Kwell  Keflex  Macrobid/Macrodantin  Diarrhea:  Penicillin      Kao-Pectate  Zithromax      Imodium AD         PUSH FLUIDS AVOID:       Cipro     Fever:  Tetracycline      Tylenol (Regular or Extra  Minocycline       Strength)  Levaquin      Extra Strength-Do not          Exceed 8 tabs/24 hrs Caffeine:        <248m/day (equiv. To 1 cup of coffee or  approx. 3 12 oz sodas)         Gas: Cold/Hayfever:       Gas-X  Benadryl      Mylicon  Claritin       Phazyme  **Claritin-D        Chlor-Trimeton    Headaches:  Dimetapp      ASA-Free Excedrin  Drixoral-Non-Drowsy     Cold Compress  Mucinex (Guaifenasin)     Tylenol (Regular or Extra  Sudafed/Sudafed-12 Hour     Strength)  **Sudafed PE Pseudoephedrine   Tylenol Cold & Sinus     Vicks Vapor Rub  Zyrtec  **AVOID if Problems With Blood Pressure         Heartburn: Avoid lying down for at least 1 hour after meals  Aciphex      Maalox     Rash:  Milk of Magnesia     Benadryl    Mylanta       1% Hydrocortisone Cream  Pepcid  Pepcid Complete   Sleep Aids:  Prevacid      Ambien   Prilosec       Benadryl  Rolaids       Chamomile Tea  Tums (Limit 4/day)     Unisom  Zantac       Tylenol PM         Warm milk-add vanilla or  Hemorrhoids:       Sugar for taste  Anusol/Anusol H.C.  (RX: Analapram 2.5%)  Sugar Substitutes:  Hydrocortisone OTC     Ok in moderation  Preparation H      Tucks        Vaseline lotion applied to tissue with wiping    Herpes:     Throat:  Acyclovir      Oragel  Famvir  Valtrex     Vaccines:         Flu Shot Leg Cramps:       *Gardasil  Benadryl      Hepatitis A         Hepatitis B Nasal  Spray:       Pneumovax  Saline Nasal Spray     Polio Booster         Tetanus Nausea:       Tuberculosis test or PPD  Vitamin B6 25 mg TID   AVOID:    Dramamine      *Gardasil  Emetrol       Live Poliovirus  Ginger Root 250 mg QID    MMR (measles, mumps &  High Complex Carbs @ Bedtime    rebella)  Sea Bands-Accupressure    Varicella (Chickenpox)  Unisom 1/2 tab TID     *No known complications           If received before Pain:         Known pregnancy;   Darvocet  Resume series after  Lortab        Delivery  Percocet    Yeast:   Tramadol      Femstat  Tylenol 3      Gyne-lotrimin  Ultram       Monistat  Vicodin           MISC:         All Sunscreens           Hair Coloring/highlights          Insect Repellant's          (Including DEET)         Mystic Tans Third Trimester of Pregnancy The third trimester is from week 29 through week 42, months 7 through 9. This trimester is when your unborn baby (fetus) is growing very fast. At the end of the ninth month, the unborn baby is about 20 inches in length. It weighs about 6-10 pounds. Follow these instructions at home:  Avoid all smoking, herbs, and alcohol. Avoid drugs not approved by your doctor.  Do not use any tobacco products, including cigarettes, chewing tobacco, and electronic cigarettes. If you need help quitting, ask your doctor. You may get counseling or other support to help you quit.  Only take medicine as told by your doctor. Some medicines are safe and some are not during pregnancy.  Exercise only as told by your doctor. Stop exercising if you start having cramps.  Eat regular, healthy meals.  Wear a good support bra if your breasts are tender.  Do not use hot tubs, steam rooms, or saunas.  Wear your seat belt when driving.  Avoid raw meat, uncooked cheese, and liter boxes and soil used by cats.  Take your prenatal vitamins.  Take 1500-2000 milligrams of calcium daily starting at the 20th week of  pregnancy until you deliver your baby.  Try taking medicine that helps you poop (stool softener) as needed, and if your doctor approves. Eat more fiber by eating fresh fruit, vegetables, and whole grains. Drink enough fluids to keep your pee (urine) clear or pale yellow.  Take warm water baths (sitz baths) to soothe pain or discomfort caused by hemorrhoids. Use hemorrhoid cream if your doctor approves.  If you have puffy, bulging veins (varicose veins), wear support hose. Raise (elevate) your feet for 15 minutes, 3-4 times a day. Limit salt in your diet.  Avoid heavy lifting, wear low heels, and sit up straight.  Rest with your legs raised if you have leg cramps or low back pain.  Visit your dentist if you have not gone during your pregnancy. Use a soft toothbrush to brush your teeth. Be gentle when you floss.  You can have sex (intercourse) unless your doctor tells you not to.  Do not travel far distances unless you must. Only do so with your doctor's approval.  Take prenatal classes.  Practice driving to the hospital.  Pack your hospital bag.  Prepare the baby's room.  Go to your doctor visits. Get help if:  You are not sure if you are in labor or if your water has broken.  You are dizzy.  You have mild cramps or pressure in your lower belly (abdominal).  You have a nagging pain in your belly area.  You continue to feel sick to your stomach (nauseous), throw up (vomit), or have watery poop (diarrhea).  You have bad smelling fluid coming from your vagina.  You have pain with peeing (urination). Get help  right away if:  You have a fever.  You are leaking fluid from your vagina.  You are spotting or bleeding from your vagina.  You have severe belly cramping or pain.  You lose or gain weight rapidly.  You have trouble catching your breath and have chest pain.  You notice sudden or extreme puffiness (swelling) of your face, hands, ankles, feet, or legs.  You  have not felt the baby move in over an hour.  You have severe headaches that do not go away with medicine.  You have vision changes. This information is not intended to replace advice given to you by your health care provider. Make sure you discuss any questions you have with your health care provider. Document Released: 04/05/2009 Document Revised: 06/17/2015 Document Reviewed: 03/12/2012 Elsevier Interactive Patient Education  2017 [employer].

## 2017-03-20 NOTE — Progress Notes (Signed)
Pt is here for an ROB visit.  Pt states there is a decrease in movement. States she has not eaten today.States there is spotting after intercourse.

## 2017-03-21 LAB — CBC
HEMATOCRIT: 32.2 % — AB (ref 34.0–46.6)
Hemoglobin: 11.1 g/dL (ref 11.1–15.9)
MCH: 29.2 pg (ref 26.6–33.0)
MCHC: 34.5 g/dL (ref 31.5–35.7)
MCV: 85 fL (ref 79–97)
PLATELETS: 215 10*3/uL (ref 150–379)
RBC: 3.8 x10E6/uL (ref 3.77–5.28)
RDW: 12.9 % (ref 12.3–15.4)
WBC: 9.3 10*3/uL (ref 3.4–10.8)

## 2017-03-21 LAB — RPR: RPR Ser Ql: NONREACTIVE

## 2017-03-21 LAB — GLUCOSE, 1 HOUR GESTATIONAL: Gestational Diabetes Screen: 83 mg/dL (ref 65–139)

## 2017-04-03 ENCOUNTER — Encounter: Payer: Self-pay | Admitting: Certified Nurse Midwife

## 2017-04-03 ENCOUNTER — Ambulatory Visit (INDEPENDENT_AMBULATORY_CARE_PROVIDER_SITE_OTHER): Payer: Medicaid Other | Admitting: Certified Nurse Midwife

## 2017-04-03 VITALS — BP 98/62 | HR 92 | Wt 109.0 lb

## 2017-04-03 DIAGNOSIS — Z3403 Encounter for supervision of normal first pregnancy, third trimester: Secondary | ICD-10-CM

## 2017-04-03 DIAGNOSIS — Z3A29 29 weeks gestation of pregnancy: Secondary | ICD-10-CM

## 2017-04-03 LAB — POCT URINALYSIS DIPSTICK
Bilirubin, UA: NEGATIVE
GLUCOSE UA: NEGATIVE
KETONES UA: NEGATIVE
Leukocytes, UA: NEGATIVE
NITRITE UA: NEGATIVE
Protein, UA: NEGATIVE
RBC UA: NEGATIVE
Urobilinogen, UA: 0.2 E.U./dL
pH, UA: 6 (ref 5.0–8.0)

## 2017-04-03 NOTE — Progress Notes (Signed)
Pt is here for an ROB visit. 

## 2017-04-03 NOTE — Addendum Note (Signed)
Addended by: Brooke DareSICK, Frimet Durfee L on: 04/03/2017 04:39 PM   Modules accepted: Orders

## 2017-04-03 NOTE — Patient Instructions (Signed)

## 2017-04-03 NOTE — Progress Notes (Signed)
ROB, doing well. No complaints. Feels good fetal movement. Follow up in 2 wks.   Doreene BurkeAnnie Scotty Pinder, CNM

## 2017-04-04 ENCOUNTER — Other Ambulatory Visit: Payer: Self-pay | Admitting: Certified Nurse Midwife

## 2017-04-04 ENCOUNTER — Other Ambulatory Visit: Payer: Self-pay

## 2017-04-04 MED ORDER — METRONIDAZOLE 0.75 % VA GEL: 1.0000 | Freq: Every day | VAGINAL | 0 refills | Status: AC

## 2017-04-18 ENCOUNTER — Ambulatory Visit (INDEPENDENT_AMBULATORY_CARE_PROVIDER_SITE_OTHER): Payer: Medicaid Other | Admitting: Obstetrics and Gynecology

## 2017-04-18 VITALS — BP 103/63 | HR 101 | Wt 112.6 lb

## 2017-04-18 DIAGNOSIS — Z3493 Encounter for supervision of normal pregnancy, unspecified, third trimester: Secondary | ICD-10-CM | POA: Diagnosis not present

## 2017-04-18 LAB — POCT URINALYSIS DIPSTICK
Bilirubin, UA: NEGATIVE
Blood, UA: NEGATIVE
Glucose, UA: NEGATIVE
Ketones, UA: NEGATIVE
Leukocytes, UA: NEGATIVE
Nitrite, UA: NEGATIVE
Protein, UA: NEGATIVE
Spec Grav, UA: 1.015
Urobilinogen, UA: 0.2 U/dL
pH, UA: 6.5

## 2017-04-18 NOTE — Patient Instructions (Signed)
Preventing Preterm Birth °Preterm birth is when your baby is delivered between 20 weeks and 37 weeks of pregnancy. A full-term pregnancy lasts for at least 37 weeks. Preterm birth can be dangerous for your baby because the last few weeks of pregnancy are an important time for your baby's brain and lungs to grow. Many things can cause a baby to be born early. Sometimes the cause is not known. There are certain factors that make you more likely to experience preterm birth, such as: °· Having a previous baby born preterm. °· Being pregnant with twins or other multiples. °· Having had fertility treatment. °· Being overweight or underweight at the start of your pregnancy. °· Having any of the following during pregnancy: °? An infection, including a urinary tract infection (UTI) or an STI (sexually transmitted infection). °? High blood pressure. °? Diabetes. °? Vaginal bleeding. °· Being age 35 or older. °· Being age 18 or younger. °· Getting pregnant within 6 months of a previous pregnancy. °· Suffering extreme stress or physical or emotional abuse during pregnancy. °· Standing for long periods of time during pregnancy, such as working at a job that requires standing. ° °What are the risks? °The most serious risk of preterm birth is that the baby may not survive. This is more likely to happen if a baby is born before 34 weeks. Other risks and complications of preterm birth may include your baby having: °· Breathing problems. °· Brain damage that affects movement and coordination (cerebral palsy). °· Feeding difficulties. °· Vision or hearing problems. °· Infections or inflammation of the digestive tract (colitis). °· Developmental delays. °· Learning disabilities. °· Higher risk for diabetes, heart disease, and high blood pressure later in life. ° °What can I do to lower my risk? °Medical care ° °The most important thing you can do to lower your risk for preterm birth is to get routine medical care during pregnancy  (prenatal care). If you have a high risk of preterm birth, you may be referred to a health care provider who specializes in managing high-risk pregnancies (perinatologist). You may be given medicine to help prevent preterm birth. °Lifestyle changes °Certain lifestyle changes can also lower your risk of preterm birth: °· Wait at least 6 months after a pregnancy to become pregnant again. °· Try to plan pregnancy for when you are between 19 and 35 years old. °· Get to a healthy weight before getting pregnant. If you are overweight, work with your health care provider to safely lose weight. °· Do not use any products that contain nicotine or tobacco, such as cigarettes and e-cigarettes. If you need help quitting, ask your health care provider. °· Do not drink alcohol. °· Do not use drugs. ° °Where to find support: °For more support, consider: °· Talking with your health care provider. °· Talking with a therapist or substance abuse counselor, if you need help quitting. °· Working with a diet and nutrition specialist (dietitian) or a personal trainer to maintain a healthy weight. °· Joining a support group. ° °Where to find more information: °Learn more about preventing preterm birth from: °· Centers for Disease Control and Prevention: cdc.gov/reproductivehealth/maternalinfanthealth/pretermbirth.htm °· March of Dimes: marchofdimes.org/complications/premature-babies.aspx °· American Pregnancy Association: americanpregnancy.org/labor-and-birth/premature-labor ° °Contact a health care provider if: °· You have any of the following signs of preterm labor before 37 weeks: °? A change or increase in vaginal discharge. °? Fluid leaking from your vagina. °? Pressure or cramps in your lower abdomen. °? A backache that does not   go away or gets worse. °? Regular tightening (contractions) in your lower abdomen. °Summary °· Preterm birth means having your baby during weeks 20-37 of pregnancy. °· Preterm birth may put your baby at risk  for physical and mental problems. °· Getting good prenatal care can help prevent preterm birth. °· You can lower your risk of preterm birth by making certain lifestyle changes, such as not smoking and not using alcohol. °This information is not intended to replace advice given to you by your health care provider. Make sure you discuss any questions you have with your health care provider. °Document Released: 02/23/2015 Document Revised: 09/18/2015 Document Reviewed: 09/18/2015 °Elsevier Interactive Patient Education © 2018 Else[employer] Inc. ° °

## 2017-04-18 NOTE — Addendum Note (Signed)
Addended by: Brooke DareSICK, Adrina Armijo L on: 04/18/2017 04:58 PM   Modules accepted: Orders

## 2017-04-18 NOTE — Progress Notes (Signed)
ROB- pt is having some abdominal pain

## 2017-04-18 NOTE — Progress Notes (Signed)
ROB-doing well,reports cramping all day with 3-4 in last hour.  Had sex yesterday morning.  Reassured and discussed PTL s/s, FFN obtained,encouraged increase water intake.

## 2017-04-19 ENCOUNTER — Encounter: Payer: Self-pay | Admitting: Obstetrics and Gynecology

## 2017-04-19 LAB — FETAL FIBRONECTIN: Fetal Fibronectin: NEGATIVE

## 2017-04-27 ENCOUNTER — Encounter: Payer: Self-pay | Admitting: Obstetrics and Gynecology

## 2017-05-02 ENCOUNTER — Ambulatory Visit (INDEPENDENT_AMBULATORY_CARE_PROVIDER_SITE_OTHER): Payer: Medicaid Other | Admitting: Certified Nurse Midwife

## 2017-05-02 VITALS — BP 98/58 | HR 103 | Wt 111.3 lb

## 2017-05-02 DIAGNOSIS — Z3A34 34 weeks gestation of pregnancy: Secondary | ICD-10-CM

## 2017-05-02 NOTE — Patient Instructions (Signed)
Group B Streptococcus Infection During Pregnancy Group B Streptococcus (GBS) is a type of bacteria (Streptococcus agalactiae) that is often found in healthy people, commonly in the rectum, vagina, and intestines. In people who are healthy and not pregnant, the bacteria rarely cause serious illness or complications. However, women who test positive for GBS during pregnancy can pass the bacteria to their baby during childbirth, which can cause serious infection in the baby after birth. Women with GBS may also have infections during their pregnancy or immediately after childbirth, such as such as urinary tract infections (UTIs) or infections of the uterus (uterine infections). Having GBS also increases a woman's risk of complications during pregnancy, such as early (preterm) labor or delivery, miscarriage, or stillbirth. Routine testing (screening) for GBS is recommended for all pregnant women. What increases the risk? You may have a higher risk for GBS infection during pregnancy if you had one during a past pregnancy. What are the signs or symptoms? In most cases, GBS infection does not cause symptoms in pregnant women. Signs and symptoms of a possible GBS-related infection may include:  Labor starting before the 37th week of pregnancy.  A UTI or bladder infection, which may cause: ? Fever. ? Pain or burning during urination. ? Frequent urination.  Fever during labor, along with: ? Bad-smelling discharge. ? Uterine tenderness. ? Rapid heartbeat in the mother, baby, or both.  Rare but serious symptoms of a possible GBS-related infection in women include:  Blood infection (septicemia). This may cause fever, chills, or confusion.  Lung infection (pneumonia). This may cause fever, chills, cough, rapid breathing, difficulty breathing, or chest pain.  Bone, joint, skin, or soft tissue infection.  How is this diagnosed? You may be screened for GBS between week 35 and week 37 of your pregnancy. If  you have symptoms of preterm labor, you may be screened earlier. This condition is diagnosed based on lab test results from:  A swab of fluid from the vagina and rectum.  A urine sample.  How is this treated? This condition is treated with antibiotic medicine. When you go into labor, or as soon as your water breaks (your membranes rupture), you will be given antibiotics through an IV tube. Antibiotics will continue until after you give birth. If you are having a cesarean delivery, you do not need antibiotics unless your membranes have already ruptured. Follow these instructions at home:  Take over-the-counter and prescription medicines only as told by your health care provider.  Take your antibiotic medicine as told by your health care provider. Do not stop taking the antibiotic even if you start to feel better.  Keep all pre-birth (prenatal) visits and follow-up visits as told by your health care provider. This is important. Contact a health care provider if:  You have pain or burning when you urinate.  You have to urinate frequently.  You have a fever or chills.  You develop a bad-smelling vaginal discharge. Get help right away if:  Your membranes rupture.  You go into labor.  You have severe pain in your abdomen.  You have difficulty breathing.  You have chest pain. This information is not intended to replace advice given to you by your health care provider. Make sure you discuss any questions you have with your health care provider. Document Released: 04/18/2007 Document Revised: 08/06/2015 Document Reviewed: 08/05/2015 Elsevier Interactive Patient Education  2018 [employer]  

## 2017-05-02 NOTE — Progress Notes (Signed)
ROB doing well. Feels good fetal movement. Discussed GBS testing at next appointment. PTL precautions reviewed. Pt has rash on right side of neck. Localized. Encouraged use of hydrocortisone cream. She verbalizes understanding and agrees  Doreene BurkeAnnie Lenardo Westwood, CNM

## 2017-05-02 NOTE — Progress Notes (Signed)
Pt is here for an ROB visit. Also states she lost her mucous plug.Some spotting after intercourse. C/o itchy stinging rash right side neck.

## 2017-05-16 ENCOUNTER — Encounter: Payer: Medicaid Other | Admitting: Obstetrics and Gynecology

## 2017-05-17 ENCOUNTER — Encounter: Payer: Self-pay | Admitting: Obstetrics and Gynecology

## 2017-05-21 ENCOUNTER — Ambulatory Visit (INDEPENDENT_AMBULATORY_CARE_PROVIDER_SITE_OTHER): Payer: Medicaid Other | Admitting: Certified Nurse Midwife

## 2017-05-21 VITALS — BP 101/59 | HR 92 | Wt 118.1 lb

## 2017-05-21 DIAGNOSIS — Z3403 Encounter for supervision of normal first pregnancy, third trimester: Secondary | ICD-10-CM | POA: Diagnosis not present

## 2017-05-21 LAB — POCT URINALYSIS DIPSTICK
BILIRUBIN UA: NEGATIVE
Blood, UA: NEGATIVE
Glucose, UA: NEGATIVE
KETONES UA: NEGATIVE
Leukocytes, UA: NEGATIVE
Nitrite, UA: NEGATIVE
PH UA: 7.5 (ref 5.0–8.0)
PROTEIN UA: NEGATIVE
Spec Grav, UA: 1.01 (ref 1.010–1.025)
UROBILINOGEN UA: 0.2 U/dL

## 2017-05-21 NOTE — Patient Instructions (Signed)
Group B Streptococcus Infection During Pregnancy Group B Streptococcus (GBS) is a type of bacteria (Streptococcus agalactiae) that is often found in healthy people, commonly in the rectum, vagina, and intestines. In people who are healthy and not pregnant, the bacteria rarely cause serious illness or complications. However, women who test positive for GBS during pregnancy can pass the bacteria to their baby during childbirth, which can cause serious infection in the baby after birth. Women with GBS may also have infections during their pregnancy or immediately after childbirth, such as such as urinary tract infections (UTIs) or infections of the uterus (uterine infections). Having GBS also increases a woman's risk of complications during pregnancy, such as early (preterm) labor or delivery, miscarriage, or stillbirth. Routine testing (screening) for GBS is recommended for all pregnant women. What increases the risk? You may have a higher risk for GBS infection during pregnancy if you had one during a past pregnancy. What are the signs or symptoms? In most cases, GBS infection does not cause symptoms in pregnant women. Signs and symptoms of a possible GBS-related infection may include:  Labor starting before the 37th week of pregnancy.  A UTI or bladder infection, which may cause: ? Fever. ? Pain or burning during urination. ? Frequent urination.  Fever during labor, along with: ? Bad-smelling discharge. ? Uterine tenderness. ? Rapid heartbeat in the mother, baby, or both.  Rare but serious symptoms of a possible GBS-related infection in women include:  Blood infection (septicemia). This may cause fever, chills, or confusion.  Lung infection (pneumonia). This may cause fever, chills, cough, rapid breathing, difficulty breathing, or chest pain.  Bone, joint, skin, or soft tissue infection.  How is this diagnosed? You may be screened for GBS between week 35 and week 37 of your pregnancy. If  you have symptoms of preterm labor, you may be screened earlier. This condition is diagnosed based on lab test results from:  A swab of fluid from the vagina and rectum.  A urine sample.  How is this treated? This condition is treated with antibiotic medicine. When you go into labor, or as soon as your water breaks (your membranes rupture), you will be given antibiotics through an IV tube. Antibiotics will continue until after you give birth. If you are having a cesarean delivery, you do not need antibiotics unless your membranes have already ruptured. Follow these instructions at home:  Take over-the-counter and prescription medicines only as told by your health care provider.  Take your antibiotic medicine as told by your health care provider. Do not stop taking the antibiotic even if you start to feel better.  Keep all pre-birth (prenatal) visits and follow-up visits as told by your health care provider. This is important. Contact a health care provider if:  You have pain or burning when you urinate.  You have to urinate frequently.  You have a fever or chills.  You develop a bad-smelling vaginal discharge. Get help right away if:  Your membranes rupture.  You go into labor.  You have severe pain in your abdomen.  You have difficulty breathing.  You have chest pain. This information is not intended to replace advice given to you by your health care provider. Make sure you discuss any questions you have with your health care provider. Document Released: 04/18/2007 Document Revised: 08/06/2015 Document Reviewed: 08/05/2015 Elsevier Interactive Patient Education  2018 [employer]  

## 2017-05-21 NOTE — Progress Notes (Signed)
ROB-pt has been having contractions timed them at 1.5 mins apart. Lower back and pelvic pain. No other concerns.

## 2017-05-21 NOTE — Progress Notes (Signed)
Patient reports doing well, has irregular contractions with some low back pain, feels good fetal movement,denies LOF, or bleeding. Discussed normalcy of braxton hicks during this time in pregnancy. Encouraged adequate fluid intake. PTL precautions discussed and reviewed. GBS/GC cultures today. Cervical exam 0.5/thick/ballotable. Taking Zoloft daily, states doing well. Questions answered and discussed. Patient verbalized understanding and in agreement.   Shanika Creacy,SNM/Ivee Poellnitz,CNM

## 2017-05-23 ENCOUNTER — Encounter (INDEPENDENT_AMBULATORY_CARE_PROVIDER_SITE_OTHER): Payer: Self-pay

## 2017-05-23 LAB — GC/CHLAMYDIA PROBE AMP
Chlamydia trachomatis, NAA: NEGATIVE
Neisseria gonorrhoeae by PCR: NEGATIVE

## 2017-05-23 LAB — STREP GP B NAA: STREP GROUP B AG: POSITIVE — AB

## 2017-05-25 ENCOUNTER — Encounter: Payer: Self-pay | Admitting: Certified Nurse Midwife

## 2017-05-27 ENCOUNTER — Observation Stay
Admission: EM | Admit: 2017-05-27 | Discharge: 2017-05-27 | Disposition: A | Discharge status: Home / Self Care | Payer: Medicaid Other | Attending: Obstetrics and Gynecology | Admitting: Obstetrics and Gynecology

## 2017-05-27 ENCOUNTER — Encounter: Payer: Self-pay | Admitting: Certified Nurse Midwife

## 2017-05-27 DIAGNOSIS — O09899 Supervision of other high risk pregnancies, unspecified trimester: Secondary | ICD-10-CM

## 2017-05-27 DIAGNOSIS — O26893 Other specified pregnancy related conditions, third trimester: Secondary | ICD-10-CM | POA: Insufficient documentation

## 2017-05-27 DIAGNOSIS — Z79899 Other long term (current) drug therapy: Secondary | ICD-10-CM | POA: Insufficient documentation

## 2017-05-27 DIAGNOSIS — Z3A37 37 weeks gestation of pregnancy: Secondary | ICD-10-CM | POA: Insufficient documentation

## 2017-05-27 DIAGNOSIS — Z3490 Encounter for supervision of normal pregnancy, unspecified, unspecified trimester: Secondary | ICD-10-CM

## 2017-05-27 DIAGNOSIS — O99513 Diseases of the respiratory system complicating pregnancy, third trimester: Principal | ICD-10-CM | POA: Insufficient documentation

## 2017-05-27 DIAGNOSIS — J45909 Unspecified asthma, uncomplicated: Secondary | ICD-10-CM | POA: Insufficient documentation

## 2017-05-27 DIAGNOSIS — Z283 Underimmunization status: Secondary | ICD-10-CM

## 2017-05-27 DIAGNOSIS — D582 Other hemoglobinopathies: Secondary | ICD-10-CM

## 2017-05-27 LAB — ROM PLUS (ARMC ONLY): ROM PLUS: NEGATIVE

## 2017-05-27 MED ORDER — ACETAMINOPHEN 500 MG PO TABS
1000.0000 mg | ORAL_TABLET | Freq: Once | ORAL | Status: AC
  Administered 2017-05-27: 1000 mg via ORAL

## 2017-05-27 MED ORDER — ACETAMINOPHEN 500 MG PO TABS
ORAL_TABLET | ORAL | Status: AC
  Filled 2017-05-27: qty 2

## 2017-05-27 NOTE — OB Triage Note (Signed)
22 y/o G1P0 [redacted]w[redacted]d [facility] d/t decreased fetal movement and LOF that began yesterday morning. Nitrazine negative. She reports mid-upper abdominal pain 5/10 that is "constant." Monitors applied and assessing.

## 2017-05-27 NOTE — Progress Notes (Signed)
Spoke with Willodean Rosenthal, CNM regarding pt report of LOF (negative nitrazine) mid-upper rbdominal pain (pt  rating 5/10) and uterine irritability noted. Plan for PO hydration, SVE, Tylenol  and ROM+

## 2017-05-28 ENCOUNTER — Ambulatory Visit (INDEPENDENT_AMBULATORY_CARE_PROVIDER_SITE_OTHER): Payer: Medicaid Other | Admitting: Certified Nurse Midwife

## 2017-05-28 VITALS — BP 108/65 | HR 86 | Wt 121.2 lb

## 2017-05-28 DIAGNOSIS — O99891 Other specified diseases and conditions complicating pregnancy: Secondary | ICD-10-CM

## 2017-05-28 DIAGNOSIS — Z3403 Encounter for supervision of normal first pregnancy, third trimester: Secondary | ICD-10-CM

## 2017-05-28 DIAGNOSIS — M549 Dorsalgia, unspecified: Secondary | ICD-10-CM | POA: Insufficient documentation

## 2017-05-28 DIAGNOSIS — O9989 Other specified diseases and conditions complicating pregnancy, childbirth and the puerperium: Secondary | ICD-10-CM

## 2017-05-28 LAB — POCT URINALYSIS DIPSTICK
Bilirubin, UA: NEGATIVE
Blood, UA: NEGATIVE
GLUCOSE UA: NEGATIVE
Ketones, UA: NEGATIVE
LEUKOCYTES UA: NEGATIVE
Nitrite, UA: NEGATIVE
Protein, UA: NEGATIVE
SPEC GRAV UA: 1.02 (ref 1.010–1.025)
Urobilinogen, UA: 0.2 E.U./dL
pH, UA: 7 (ref 5.0–8.0)

## 2017-05-28 MED ORDER — CYCLOBENZAPRINE HCL 5 MG PO TABS: 5.0000 mg | ORAL_TABLET | Freq: Three times a day (TID) | ORAL | 0 refills | Status: DC | PRN

## 2017-05-28 NOTE — Patient Instructions (Addendum)
Vaginal Delivery Vaginal delivery means that you will give birth by pushing your baby out of your birth canal (vagina). A team of health care providers will help you before, during, and after vaginal delivery. Birth experiences are unique for every woman and every pregnancy, and birth experiences vary depending on where you choose to give birth. What should I do to prepare for my baby's birth? Before your baby is born, it is important to talk with your health care provider about:  Your labor and delivery preferences. These may include: ? Medicines that you may be given. ? How you will manage your pain. This might include non-medical pain relief techniques or injectable pain relief such as epidural analgesia. ? How you and your baby will be monitored during labor and delivery. ? Who may be in the labor and delivery room with you. ? Your feelings about surgical delivery of your baby (cesarean delivery, or C-section) if this becomes necessary. ? Your feelings about receiving donated blood through an IV tube (blood transfusion) if this becomes necessary.  Whether you are able: ? To take pictures or videos of the birth. ? To eat during labor and delivery. ? To move around, walk, or change positions during labor and delivery.  What to expect after your baby is born, such as: ? Whether delayed umbilical cord clamping and cutting is offered. ? Who will care for your baby right after birth. ? Medicines or tests that may be recommended for your baby. ? Whether breastfeeding is supported in your hospital or birth center. ? How long you will be in the hospital or birth center.  How any medical conditions you have may affect your baby or your labor and delivery experience.  To prepare for your baby's birth, you should also:  Attend all of your health care visits before delivery (prenatal visits) as recommended by your health care provider. This is important.  Prepare your home for your baby's  arrival. Make sure that you have: ? Diapers. ? Baby clothing. ? Feeding equipment. ? Safe sleeping arrangements for you and your baby.  Install a car seat in your vehicle. Have your car seat checked by a certified car seat installer to make sure that it is installed safely.  Think about who will help you with your new baby at home for at least the first several weeks after delivery.  What can I expect when I arrive at the birth center or hospital? Once you are in labor and have been admitted into the hospital or birth center, your health care provider may:  Review your pregnancy history and any concerns you have.  Insert an IV tube into one of your veins. This is used to give you fluids and medicines.  Check your blood pressure, pulse, temperature, and heart rate (vital signs).  Check whether your bag of water (amniotic sac) has broken (ruptured).  Talk with you about your birth plan and discuss pain control options.  Monitoring Your health care provider may monitor your contractions (uterine monitoring) and your baby's heart rate (fetal monitoring). You may need to be monitored:  Often, but not continuously (intermittently).  All the time or for long periods at a time (continuously). Continuous monitoring may be needed if: ? You are taking certain medicines, such as medicine to relieve pain or make your contractions stronger. ? You have pregnancy or labor complications.  Monitoring may be done by:  Placing a special stethoscope or a handheld monitoring device on your abdomen to   check your baby's heartbeat, and feeling your abdomen for contractions. This method of monitoring does not continuously record your baby's heartbeat or your contractions.  Placing monitors on your abdomen (external monitors) to record your baby's heartbeat and the frequency and length of contractions. You may not have to wear external monitors all the time.  Placing monitors inside of your uterus  (internal monitors) to record your baby's heartbeat and the frequency, length, and strength of your contractions. ? Your health care provider may use internal monitors if he or she needs more information about the strength of your contractions or your baby's heart rate. ? Internal monitors are put in place by passing a thin, flexible wire through your vagina and into your uterus. Depending on the type of monitor, it may remain in your uterus or on your baby's head until birth. ? Your health care provider will discuss the benefits and risks of internal monitoring with you and will ask for your permission before inserting the monitors.  Telemetry. This is a type of continuous monitoring that can be done with external or internal monitors. Instead of having to stay in bed, you are able to move around during telemetry. Ask your health care provider if telemetry is an option for you.  Physical exam Your health care provider may perform a physical exam. This may include:  Checking whether your baby is positioned: ? With the head toward your vagina (head-down). This is most common. ? With the head toward the top of your uterus (head-up or breech). If your baby is in a breech position, your health care provider may try to turn your baby to a head-down position so you can deliver vaginally. If it does not seem that your baby can be born vaginally, your provider may recommend surgery to deliver your baby. In rare cases, you may be able to deliver vaginally if your baby is head-up (breech delivery). ? Lying sideways (transverse). Babies that are lying sideways cannot be delivered vaginally.  Checking your cervix to determine: ? Whether it is thinning out (effacing). ? Whether it is opening up (dilating). ? How low your baby has moved into your birth canal.  What are the three stages of labor and delivery?  Normal labor and delivery is divided into the following three stages: Stage 1  Stage 1 is the  longest stage of labor, and it can last for hours or days. Stage 1 includes: ? Early labor. This is when contractions may be irregular, or regular and mild. Generally, early labor contractions are more than 10 minutes apart. ? Active labor. This is when contractions get longer, more regular, more frequent, and more intense. ? The transition phase. This is when contractions happen very close together, are very intense, and may last longer than during any other part of labor.  Contractions generally feel mild, infrequent, and irregular at first. They get stronger, more frequent (about every 2-3 minutes), and more regular as you progress from early labor through active labor and transition.  Many women progress through stage 1 naturally, but you may need help to continue making progress. If this happens, your health care provider may talk with you about: ? Rupturing your amniotic sac if it has not ruptured yet. ? Giving you medicine to help make your contractions stronger and more frequent.  Stage 1 ends when your cervix is completely dilated to 4 inches (10 cm) and completely effaced. This happens at the end of the transition phase. Stage 2  Once   your cervix is completely effaced and dilated to 4 inches (10 cm), you may start to feel an urge to push. It is common for the body to naturally take a rest before feeling the urge to push, especially if you received an epidural or certain other pain medicines. This rest period may last for up to 1-2 hours, depending on your unique labor experience.  During stage 2, contractions are generally less painful, because pushing helps relieve contraction pain. Instead of contraction pain, you may feel stretching and burning pain, especially when the widest part of your baby's head passes through the vaginal opening (crowning).  Your health care provider will closely monitor your pushing progress and your baby's progress through the vagina during stage 2.  Your  health care provider may massage the area of skin between your vaginal opening and anus (perineum) or apply warm compresses to your perineum. This helps it stretch as the baby's head starts to crown, which can help prevent perineal tearing. ? In some cases, an incision may be made in your perineum (episiotomy) to allow the baby to pass through the vaginal opening. An episiotomy helps to make the opening of the vagina larger to allow more room for the baby to fit through.  It is very important to breathe and focus so your health care provider can control the delivery of your baby's head. Your health care provider may have you decrease the intensity of your pushing, to help prevent perineal tearing.  After delivery of your baby's head, the shoulders and the rest of the body generally deliver very quickly and without difficulty.  Once your baby is delivered, the umbilical cord may be cut right away, or this may be delayed for 1-2 minutes, depending on your baby's health. This may vary among health care providers, hospitals, and birth centers.  If you and your baby are healthy enough, your baby may be placed on your chest or abdomen to help maintain the baby's temperature and to help you bond with each other. Some mothers and babies start breastfeeding at this time. Your health care team will dry your baby and help keep your baby warm during this time.  Your baby may need immediate care if he or she: ? Showed signs of distress during labor. ? Has a medical condition. ? Was born too early (prematurely). ? Had a bowel movement before birth (meconium). ? Shows signs of difficulty transitioning from being inside the uterus to being outside of the uterus. If you are planning to breastfeed, your health care team will help you begin a feeding. Stage 3  The third stage of labor starts immediately after the birth of your baby and ends after you deliver the placenta. The placenta is an organ that develops  during pregnancy to provide oxygen and nutrients to your baby in the womb.  Delivering the placenta may require some pushing, and you may have mild contractions. Breastfeeding can stimulate contractions to help you deliver the placenta.  After the placenta is delivered, your uterus should tighten (contract) and become firm. This helps to stop bleeding in your uterus. To help your uterus contract and to control bleeding, your health care provider may: ? Give you medicine by injection, through an IV tube, by mouth, or through your rectum (rectally). ? Massage your abdomen or perform a vaginal exam to remove any blood clots that are left in your uterus. ? Empty your bladder by placing a thin, flexible tube (catheter) into your bladder. ? Encourage  you to breastfeed your baby. After labor is over, you and your baby will be monitored closely to ensure that you are both healthy until you are ready to go home. Your health care team will teach you how to care for yourself and your baby. This information is not intended to replace advice given to you by your health care provider. Make sure you discuss any questions you have with your health care provider. Document Released: 10/19/2007 Document Revised: 07/30/2015 Document Reviewed: 01/24/2015 Elsevier Interactive Patient Education  2018 [employer]. Common Medications Safe in Pregnancy  Acne:      Constipation:  Benzoyl Peroxide     Colace  Clindamycin      Dulcolax Suppository  Topica Erythromycin     Fibercon  Salicylic Acid      Metamucil         Miralax AVOID:        Senakot   Accutane    Cough:  Retin-A       Cough Drops  Tetracycline      Phenergan w/ Codeine if Rx  Minocycline      Robitussin (Plain & DM)  Antibiotics:     Crabs/Lice:  Ceclor       RID  Cephalosporins    AVOID:  E-Mycins      Kwell  Keflex  Macrobid/Macrodantin   Diarrhea:  Penicillin      Kao-Pectate  Zithromax      Imodium AD         PUSH  FLUIDS AVOID:       Cipro     Fever:  Tetracycline      Tylenol (Regular or Extra  Minocycline       Strength)  Levaquin      Extra Strength-Do not          Exceed 8 tabs/24 hrs Caffeine:        <232m/day (equiv. To 1 cup of coffee or  approx. 3 12 oz sodas)         Gas: Cold/Hayfever:       Gas-X  Benadryl      Mylicon  Claritin       Phazyme  **Claritin-D        Chlor-Trimeton    Headaches:  Dimetapp      ASA-Free Excedrin  Drixoral-Non-Drowsy     Cold Compress  Mucinex (Guaifenasin)     Tylenol (Regular or Extra  Sudafed/Sudafed-12 Hour     Strength)  **Sudafed PE Pseudoephedrine   Tylenol Cold & Sinus     Vicks Vapor Rub  Zyrtec  **AVOID if Problems With Blood Pressure         Heartburn: Avoid lying down for at least 1 hour after meals  Aciphex      Maalox     Rash:  Milk of Magnesia     Benadryl    Mylanta       1% Hydrocortisone Cream  Pepcid  Pepcid Complete   Sleep Aids:  Prevacid      Ambien   Prilosec       Benadryl  Rolaids       Chamomile Tea  Tums (Limit 4/day)     Unisom  Zantac       Tylenol PM         Warm milk-add vanilla or  Hemorrhoids:       Sugar for taste  Anusol/Anusol H.C.  (RX: Analapram 2.5%)  Sugar Substitutes:  Hydrocortisone OTC  Ok in moderation  Preparation H      Tucks        Vaseline lotion applied to tissue with wiping    Herpes:     Throat:  Acyclovir      Oragel  Famvir  Valtrex     Vaccines:         Flu Shot Leg Cramps:       *Gardasil  Benadryl      Hepatitis A         Hepatitis B Nasal Spray:       Pneumovax  Saline Nasal Spray     Polio Booster         Tetanus Nausea:       Tuberculosis test or PPD  Vitamin B6 25 mg TID   AVOID:    Dramamine      *Gardasil  Emetrol       Live Poliovirus  Ginger Root 250 mg QID    MMR (measles, mumps &  High Complex Carbs @ Bedtime    rebella)  Sea Bands-Accupressure    Varicella (Chickenpox)  Unisom 1/2 tab TID     *No known complications           If received  before Pain:         Known pregnancy;   Darvocet       Resume series after  Lortab        Delivery  Percocet    Yeast:   Tramadol      Femstat  Tylenol 3      Gyne-lotrimin  Ultram       Monistat  Vicodin           MISC:         All Sunscreens           Hair Coloring/highlights          Insect Repellant's          (Including DEET)         Mystic Tans Cyclobenzaprine tablets What is this medicine? CYCLOBENZAPRINE (sye kloe BEN za preen) is a muscle relaxer. It is used to treat muscle pain, spasms, and stiffness. This medicine may be used for other purposes; ask your health care provider or pharmacist if you have questions. COMMON BRAND NAME(S): Fexmid, Flexeril What should I tell my health care provider before I take this medicine? They need to know if you have any of these conditions: -heart disease, irregular heartbeat, or previous heart attack -liver disease -thyroid problem -an unusual or allergic reaction to cyclobenzaprine, tricyclic antidepressants, lactose, other medicines, foods, dyes, or preservatives -pregnant or trying to get pregnant -breast-feeding How should I use this medicine? Take this medicine by mouth with a glass of water. Follow the directions on the prescription label. If this medicine upsets your stomach, take it with food or milk. Take your medicine at regular intervals. Do not take it more often than directed. Talk to your pediatrician regarding the use of this medicine in children. Special care may be needed. Overdosage: If you think you have taken too much of this medicine contact a poison control center or emergency room at once. NOTE: This medicine is only for you. Do not share this medicine with others. What if I miss a dose? If you miss a dose, take it as soon as you can. If it is almost time for your next dose, take only that dose. Do not take double or extra doses. What may interact  with this medicine? Do not take this medicine with any of the  following medications: -certain medicines for fungal infections like fluconazole, itraconazole, ketoconazole, posaconazole, voriconazole -cisapride -dofetilide -dronedarone -halofantrine -levomethadyl -MAOIs like Carbex, Eldepryl, Marplan, Nardil, and Parnate -narcotic medicines for cough -pimozide -thioridazine -ziprasidone This medicine may also interact with the following medications: -alcohol -antihistamines for allergy, cough and cold -certain medicines for anxiety or sleep -certain medicines for cancer -certain medicines for depression like amitriptyline, fluoxetine, sertraline -certain medicines for infection like alfuzosin, chloroquine, clarithromycin, levofloxacin, mefloquine, pentamidine, troleandomycin -certain medicines for irregular heart beat -certain medicines for seizures like phenobarbital, primidone -contrast dyes -general anesthetics like halothane, isoflurane, methoxyflurane, propofol -local anesthetics like lidocaine, pramoxine, tetracaine -medicines that relax muscles for surgery -narcotic medicines for pain -other medicines that prolong the QT interval (cause an abnormal heart rhythm) -phenothiazines like chlorpromazine, mesoridazine, prochlorperazine This list may not describe all possible interactions. Give your health care provider a list of all the medicines, herbs, non-prescription drugs, or dietary supplements you use. Also tell them if you smoke, drink alcohol, or use illegal drugs. Some items may interact with your medicine. What should I watch for while using this medicine? Tell your doctor or health care professional if your symptoms do not start to get better or if they get worse. You may get drowsy or dizzy. Do not drive, use machinery, or do anything that needs mental alertness until you know how this medicine affects you. Do not stand or sit up quickly, especially if you are an older patient. This reduces the risk of dizzy or fainting spells.  Alcohol may interfere with the effect of this medicine. Avoid alcoholic drinks. If you are taking another medicine that also causes drowsiness, you may have more side effects. Give your health care provider a list of all medicines you use. Your doctor will tell you how much medicine to take. Do not take more medicine than directed. Call emergency for help if you have problems breathing or unusual sleepiness. Your mouth may get dry. Chewing sugarless gum or sucking hard candy, and drinking plenty of water may help. Contact your doctor if the problem does not go away or is severe. What side effects may I notice from receiving this medicine? Side effects that you should report to your doctor or health care professional as soon as possible: -allergic reactions like skin rash, itching or hives, swelling of the face, lips, or tongue -breathing problems -chest pain -fast, irregular heartbeat -hallucinations -seizures -unusually weak or tired Side effects that usually do not require medical attention (report to your doctor or health care professional if they continue or are bothersome): -headache -nausea, vomiting This list may not describe all possible side effects. Call your doctor for medical advice about side effects. You may report side effects to FDA at 1-800-FDA-1088. Where should I keep my medicine? Keep out of the reach of children. Store at room temperature between 15 and 30 degrees C (59 and 86 degrees F). Keep container tightly closed. Throw away any unused medicine after the expiration date. NOTE: This sheet is a summary. It may not cover all possible information. If you have questions about this medicine, talk to your doctor, pharmacist, or health care provider.  2018 Elsevier/Gold Standard (2014-10-20 12:05:46)  Back Pain in Pregnancy Back pain during pregnancy is common. Back pain may be caused by several factors that are related to changes during your pregnancy. Follow these  instructions at home: Managing pain, stiffness, and swelling  If directed, apply  ice for sudden (acute) back pain. ? Put ice in a plastic bag. ? Place a towel between your skin and the bag. ? Leave the ice on for 20 minutes, 2-3 times per day.  If directed, apply heat to the affected area before you exercise: ? Place a towel between your skin and the heat pack or heating pad. ? Leave the heat on for 20-30 minutes. ? Remove the heat if your skin turns bright red. This is especially important if you are unable to feel pain, heat, or cold. You may have a greater risk of getting burned. Activity  Exercise as told by your health care provider. Exercising is the best way to prevent or manage back pain.  Listen to your body when lifting. If lifting hurts, ask for help or bend your knees. This uses your leg muscles instead of your back muscles.  Squat down when picking up something from the floor. Do not bend over.  Only use bed rest as told by your health care provider. Bed rest should only be used for the most severe episodes of back pain. Standing, Sitting, and Lying Down  Do not stand in one place for long periods of time.  Use good posture when sitting. Make sure your head rests over your shoulders and is not hanging forward. Use a pillow on your lower back if necessary.  Try sleeping on your side, preferably the left side, with a pillow or two between your legs. If you are sore after a night's rest, your bed may be too soft. A firm mattress may provide more support for your back during pregnancy. General instructions  Do not wear high heels.  Eat a healthy diet. Try to gain weight within your health care provider's recommendations.  Use a maternity girdle, elastic sling, or back brace as told by your health care provider.  Take over-the-counter and prescription medicines only as told by your health care provider.  Keep all follow-up visits as told by your health care provider.  This is important. This includes any visits with any specialists, such as a physical therapist. Contact a health care provider if:  Your back pain interferes with your daily activities.  You have increasing pain in other parts of your body. Get help right away if:  You develop numbness, tingling, weakness, or problems with the use of your arms or legs.  You develop severe back pain that is not controlled with medicine.  You have a sudden change in bowel or bladder control.  You develop shortness of breath, dizziness, or you faint.  You develop nausea, vomiting, or sweating.  You have back pain that is a rhythmic, cramping pain similar to labor pains. Labor pain is usually 1-2 minutes apart, lasts for about 1 minute, and involves a bearing down feeling or pressure in your pelvis.  You have back pain and your water breaks or you have vaginal bleeding.  You have back pain or numbness that travels down your leg.  Your back pain developed after you fell.  You develop pain on one side of your back.  You see blood in your urine.  You develop skin blisters in the area of your back pain. This information is not intended to replace advice given to you by your health care provider. Make sure you discuss any questions you have with your health care provider. Document Released: 04/19/2005 Document Revised: 06/17/2015 Document Reviewed: 09/23/2014 Elsevier Interactive Patient Education  [employer].

## 2017-05-28 NOTE — Progress Notes (Signed)
Pt is here for an ROB visit. C/o left foot and ankle swelling and painful.

## 2017-05-28 NOTE — Progress Notes (Signed)
ROB-Reports left foot/ankle swelling and intermittent back pain. Negative homan sign, no erythema noted. Discussed home treatment measures. Rx: Flexeril, see orders. Reviewed red flag symptoms and when to call. RTC x 1 week for ROB or sooner if needed.

## 2017-05-30 NOTE — Discharge Summary (Addendum)
Obstetric Discharge Summary  Patient ID: Shelby Kelly MRN: [id number] DOB/AGE: [birth year] 22 y.o.   Date of Admission: 05/27/2017 Serafina Royals, CNM Charlena Cross, MD)  Date of Discharge: 05/27/2017 Serafina Royals, CNM Charlena Cross, MD)  Admitting Diagnosis: Observation at [redacted]w[redacted]d  Secondary Diagnosis: Asthma, History positive drug screen, Hemoglobin c trait     Discharge Diagnosis: No other diagnosis   Antepartum Procedures: NST   Brief Hospital Course   L&D OB Triage Note  Shelby Kelly is a 22 y.o. G1P0000 female at [redacted]w[redacted]d, EDD Estimated Date of Delivery: 06/13/17 who presented to triage for complaints of decreased fetal movement, abdominal pain, and leakage of fluid.  She was evaluated by the nurses with no significant findings for labor or fetal distress. Vital signs stable. An NST was performed and has been reviewed by MD. She was treated with oral hydration and Tylenol.   NST INTERPRETATION: Indications: rule out uterine contractions  Mode: External Baseline Rate (A): 140 bpm Variability: Moderate Accelerations: 15 x 15 Decelerations: None Contraction Frequency (min): irregular   Dilation: Fingertip Effacement (%): Thick Station: Ballotable Exam by:: Mayo Ao Impression: reactive  Plan: NST performed was reviewed and was found to be reactive. She was discharged home with bleeding/labor precautions.  Continue routine prenatal care. Follow up with OB/GYN as previously scheduled.   Discharge Instructions: Per After Visit Summary.  Activity: Advance as tolerated. Pelvic rest for 6 weeks.  Also refer to After Visit Summary  Diet: Regular  Medications:  Allergies as of 05/27/2017      Reactions   Aspergillus (mixed) Allergy Skin Test Hives, Itching, Shortness Of Breath, Swelling      Medication List    ASK your doctor about these medications   ASMANEX HFA 100 MCG/ACT Aero Generic drug:  Mometasone Furoate Inhale 2 puffs into the lungs every 12 (twelve)  hours.   prenatal multivitamin Tabs tablet Take 1 tablet by mouth daily at 12 noon.   PROAIR HFA 108 (90 Base) MCG/ACT inhaler Generic drug:  albuterol INHALE 2 PUFFS Q 4 - 6 H PRN   albuterol (2.5 MG/3ML) 0.083% nebulizer solution Commonly known as:  PROVENTIL Take 3 mLs (2.5 mg total) by nebulization every 4 (four) hours as needed for wheezing or shortness of breath.   sertraline 25 MG tablet Commonly known as:  ZOLOFT Take 1 tablet (25 mg total) by mouth daily.       Discharged Condition: stable  Discharged to: home   Gunnar Bulla, CNM [facility], [facility]

## 2017-06-04 ENCOUNTER — Ambulatory Visit (INDEPENDENT_AMBULATORY_CARE_PROVIDER_SITE_OTHER): Payer: Medicaid Other | Admitting: Certified Nurse Midwife

## 2017-06-04 ENCOUNTER — Encounter: Payer: Self-pay | Admitting: Certified Nurse Midwife

## 2017-06-04 VITALS — BP 113/61 | HR 84 | Wt 120.9 lb

## 2017-06-04 DIAGNOSIS — Z3403 Encounter for supervision of normal first pregnancy, third trimester: Secondary | ICD-10-CM

## 2017-06-04 LAB — POCT URINALYSIS DIPSTICK
Bilirubin, UA: NEGATIVE
Glucose, UA: NEGATIVE
KETONES UA: NEGATIVE
NITRITE UA: NEGATIVE
Odor: NEGATIVE
PH UA: 6.5 (ref 5.0–8.0)
PROTEIN UA: NEGATIVE
Spec Grav, UA: 1.01 (ref 1.010–1.025)
UROBILINOGEN UA: 0.2 U/dL

## 2017-06-04 NOTE — Progress Notes (Signed)
ROB- Pt had VB after IC yesterday. Now only light brown spotting.

## 2017-06-04 NOTE — Patient Instructions (Signed)
Braxton Hicks Contractions °Contractions of the uterus can occur throughout pregnancy, but they are not always a sign that you are in labor. You may have practice contractions called Braxton Hicks contractions. These false labor contractions are sometimes confused with true labor. °What are Braxton Hicks contractions? °Braxton Hicks contractions are tightening movements that occur in the muscles of the uterus before labor. Unlike true labor contractions, these contractions do not result in opening (dilation) and thinning of the cervix. Toward the end of pregnancy (32-34 weeks), Braxton Hicks contractions can happen more often and may become stronger. These contractions are sometimes difficult to tell apart from true labor because they can be very uncomfortable. You should not feel embarrassed if you go to the hospital with false labor. °Sometimes, the only way to tell if you are in true labor is for your health care provider to look for changes in the cervix. The health care provider will do a physical exam and may monitor your contractions. If you are not in true labor, the exam should show that your cervix is not dilating and your water has not broken. °If there are other health problems associated with your pregnancy, it is completely safe for you to be sent home with false labor. You may continue to have Braxton Hicks contractions until you go into true labor. °How to tell the difference between true labor and false labor °True labor °· Contractions last 30-70 seconds. °· Contractions become very regular. °· Discomfort is usually felt in the top of the uterus, and it spreads to the lower abdomen and low back. °· Contractions do not go away with walking. °· Contractions usually become more intense and increase in frequency. °· The cervix dilates and gets thinner. °False labor °· Contractions are usually shorter and not as strong as true labor contractions. °· Contractions are usually irregular. °· Contractions  are often felt in the front of the lower abdomen and in the groin. °· Contractions may go away when you walk around or change positions while lying down. °· Contractions get weaker and are shorter-lasting as time goes on. °· The cervix usually does not dilate or become thin. °Follow these instructions at home: °· Take over-the-counter and prescription medicines only as told by your health care provider. °· Keep up with your usual exercises and follow other instructions from your health care provider. °· Eat and drink lightly if you think you are going into labor. °· If Braxton Hicks contractions are making you uncomfortable: °? Change your position from lying down or resting to walking, or change from walking to resting. °? Sit and rest in a tub of warm water. °? Drink enough fluid to keep your urine pale yellow. Dehydration may cause these contractions. °? Do slow and deep breathing several times an hour. °· Keep all follow-up prenatal visits as told by your health care provider. This is important. °Contact a health care provider if: °· You have a fever. °· You have continuous pain in your abdomen. °Get help right away if: °· Your contractions become stronger, more regular, and closer together. °· You have fluid leaking or gushing from your vagina. °· You pass blood-tinged mucus (bloody show). °· You have bleeding from your vagina. °· You have low back pain that you never had before. °· You feel your baby’s head pushing down and causing pelvic pressure. °· Your baby is not moving inside you as much as it used to. °Summary °· Contractions that occur before labor are called Braxton   Hicks contractions, false labor, or practice contractions. °· Braxton Hicks contractions are usually shorter, weaker, farther apart, and less regular than true labor contractions. True labor contractions usually become progressively stronger and regular and they become more frequent. °· Manage discomfort from Braxton Hicks contractions by  changing position, resting in a warm bath, drinking plenty of water, or practicing deep breathing. °This information is not intended to replace advice given to you by your health care provider. Make sure you discuss any questions you have with your health care provider. °Document Released: 05/25/2016 Document Revised: 05/25/2016 Document Reviewed: 05/25/2016 °Elsevier Interactive Patient Education © 2018 [employer]vier [employer] ° °

## 2017-06-04 NOTE — Progress Notes (Signed)
ROB, doing well. Feels good movement. Has no complaints. Labor precautions reviewed. Follow up 1 wk.   Doreene Burke, CNM

## 2017-06-05 ENCOUNTER — Other Ambulatory Visit: Payer: Self-pay

## 2017-06-05 ENCOUNTER — Encounter: Payer: Self-pay | Admitting: Certified Nurse Midwife

## 2017-06-05 MED ORDER — METRONIDAZOLE 0.75 % VA GEL: 1.0000 | Freq: Every day | VAGINAL | 1 refills | Status: DC

## 2017-06-11 ENCOUNTER — Observation Stay
Admission: EM | Admit: 2017-06-11 | Discharge: 2017-06-11 | Disposition: A | Discharge status: Home / Self Care | Payer: Medicaid Other | Attending: Obstetrics and Gynecology | Admitting: Obstetrics and Gynecology

## 2017-06-11 ENCOUNTER — Encounter: Payer: Medicaid Other | Admitting: Certified Nurse Midwife

## 2017-06-11 DIAGNOSIS — O471 False labor at or after 37 completed weeks of gestation: Secondary | ICD-10-CM | POA: Diagnosis not present

## 2017-06-11 DIAGNOSIS — Z3A39 39 weeks gestation of pregnancy: Secondary | ICD-10-CM

## 2017-06-11 DIAGNOSIS — O09899 Supervision of other high risk pregnancies, unspecified trimester: Secondary | ICD-10-CM

## 2017-06-11 DIAGNOSIS — Z283 Underimmunization status: Secondary | ICD-10-CM

## 2017-06-11 DIAGNOSIS — D582 Other hemoglobinopathies: Secondary | ICD-10-CM

## 2017-06-11 DIAGNOSIS — O26893 Other specified pregnancy related conditions, third trimester: Secondary | ICD-10-CM | POA: Diagnosis not present

## 2017-06-11 NOTE — OB Triage Note (Signed)
22 y/o G1P0 [redacted]w[redacted]d presents [facility] due to ctx that began "a couple of hours ago" and were 5-6 minutes apart, rating "5" on the 0-10 pain scale. Denies LOF, reports positive fetal movement.

## 2017-06-11 NOTE — Progress Notes (Signed)
Pt ambulating on unit per conversation with Serafina Royals, CNM. SVE recheck at 1715.

## 2017-06-11 NOTE — Discharge Instructions (Signed)
Call and schedule follow up visit for Thursday or Friday of this week, if you haven't already scheduled one.

## 2017-06-12 ENCOUNTER — Encounter: Payer: Self-pay | Admitting: Certified Nurse Midwife

## 2017-06-13 ENCOUNTER — Ambulatory Visit (INDEPENDENT_AMBULATORY_CARE_PROVIDER_SITE_OTHER): Payer: Medicaid Other | Admitting: Certified Nurse Midwife

## 2017-06-13 VITALS — BP 111/74 | HR 88 | Wt 125.2 lb

## 2017-06-13 DIAGNOSIS — Z3403 Encounter for supervision of normal first pregnancy, third trimester: Secondary | ICD-10-CM

## 2017-06-13 LAB — POCT URINALYSIS DIPSTICK
Bilirubin, UA: NEGATIVE
Glucose, UA: NEGATIVE
Ketones, UA: NEGATIVE
LEUKOCYTES UA: NEGATIVE
Nitrite, UA: NEGATIVE
PH UA: 6 (ref 5.0–8.0)
Protein, UA: NEGATIVE
RBC UA: NEGATIVE
SPEC GRAV UA: 1.01 (ref 1.010–1.025)
Urobilinogen, UA: 0.2 E.U./dL

## 2017-06-13 NOTE — Progress Notes (Signed)
Pt is here with c/o feet swelling and numbness but not as bad as last night. Pt states she has a slight headache. Denies blurred vision.

## 2017-06-13 NOTE — Patient Instructions (Addendum)
Labor Induction Labor induction is when steps are taken to cause a pregnant woman to begin the labor process. Most women go into labor on their own between 37 weeks and 42 weeks of the pregnancy. When this does not happen or when there is a medical need, methods may be used to induce labor. Labor induction causes a pregnant woman's uterus to contract. It also causes the cervix to soften (ripen), open (dilate), and thin out (efface). Usually, labor is not induced before 39 weeks of the pregnancy unless there is a problem with the baby or mother. Before inducing labor, your health care provider will consider a number of factors, including the following:  The medical condition of you and the baby.  How many weeks along you are.  The status of the baby's lung maturity.  The condition of the cervix.  The position of the baby.  What are the reasons for labor induction? Labor may be induced for the following reasons:  The health of the baby or mother is at risk.  The pregnancy is overdue by 1 week or more.  The water breaks but labor does not start on its own.  The mother has a health condition or serious illness, such as high blood pressure, infection, placental abruption, or diabetes.  The amniotic fluid amounts are low around the baby.  The baby is distressed.  Convenience or wanting the baby to be born on a certain date is not a reason for inducing labor. What methods are used for labor induction? Several methods of labor induction may be used, such as:  Prostaglandin medicine. This medicine causes the cervix to dilate and ripen. The medicine will also start contractions. It can be taken by mouth or by inserting a suppository into the vagina.  Inserting a thin tube (catheter) with a balloon on the end into the vagina to dilate the cervix. Once inserted, the balloon is expanded with water, which causes the cervix to open.  Stripping the membranes. Your health care provider separates  amniotic sac tissue from the cervix, causing the cervix to be stretched and causing the release of a hormone called progesterone. This may cause the uterus to contract. It is often done during an office visit. You will be sent home to wait for the contractions to begin. You will then come in for an induction.  Breaking the water. Your health care provider makes a hole in the amniotic sac using a small instrument. Once the amniotic sac breaks, contractions should begin. This may still take hours to see an effect.  Medicine to trigger or strengthen contractions. This medicine is given through an IV access tube inserted into a vein in your arm.  All of the methods of induction, besides stripping the membranes, will be done in the hospital. Induction is done in the hospital so that you and the baby can be carefully monitored. How long does it take for labor to be induced? Some inductions can take up to 2-3 days. Depending on the cervix, it usually takes less time. It takes longer when you are induced early in the pregnancy or if this is your first pregnancy. If a mother is still pregnant and the induction has been going on for 2-3 days, either the mother will be sent home or a cesarean delivery will be needed. What are the risks associated with labor induction? Some of the risks of induction include:  Changes in fetal heart rate, such as too high, too low, or erratic.    Fetal distress.  Chance of infection for the mother and baby.  Increased chance of having a cesarean delivery.  Breaking off (abruption) of the placenta from the uterus (rare).  Uterine rupture (very rare).  When induction is needed for medical reasons, the benefits of induction may outweigh the risks. What are some reasons for not inducing labor? Labor induction should not be done if:  It is shown that your baby does not tolerate labor.  You have had previous surgeries on your uterus, such as a myomectomy or the removal of  fibroids.  Your placenta lies very low in the uterus and blocks the opening of the cervix (placenta previa).  Your baby is not in a head-down position.  The umbilical cord drops down into the birth canal in front of the baby. This could cut off the baby's blood and oxygen supply.  You have had a previous cesarean delivery.  There are unusual circumstances, such as the baby being extremely premature.  This information is not intended to replace advice given to you by your health care provider. Make sure you discuss any questions you have with your health care provider. Document Released: 05/31/2006 Document Revised: 06/17/2015 Document Reviewed: 08/08/2012 Elsevier Interactive Patient Education  2017 Elsevier Inc[employer] fe Fetal Movement Counts Patient Name: ________________________________________________ Patient Due Date: ____________________ What is a fetal movement count? A fetal movement count is the number of times that you feel your baby move during a certain amount of time. This may also be called a fetal kick count. A fetal movement count is recommended for every pregnant woman. You may be asked to start counting fetal movements as early as week 28 of your pregnancy. Pay attention to when your baby is most active. You may notice your baby's sleep and wake cycles. You may also notice things that make your baby move more. You should do a fetal movement count:  When your baby is normally most active.  At the same time each day.  A good time to count movements is while you are resting, after having something to eat and drink. How do I count fetal movements? 1. Find a quiet, comfortable area. Sit, or lie down on your side. 2. Write down the date, the start time and stop time, and the number of movements that you felt between those two times. Take this information with you to your health care visits. 3. For 2 hours, count kicks, flutters, swishes, rolls, and jabs. You should feel at least  10 movements during 2 hours. 4. You may stop counting after you have felt 10 movements. 5. If you do not feel 10 movements in 2 hours, have something to eat and drink. Then, keep resting and counting for 1 hour. If you feel at least 4 movements during that hour, you may stop counting. Contact a health care provider if:  You feel fewer than 4 movements in 2 hours.  Your baby is not moving like he or she usually does. Date: ____________ Start time: ____________ Stop time: ____________ Movements: ____________ Date: ____________ Start time: ____________ Stop time: ____________ Movements: ____________ Date: ____________ Start time: ____________ Stop time: ____________ Movements: ____________ Date: ____________ Start time: ____________ Stop time: ____________ Movements: ____________ Date: ____________ Start time: ____________ Stop time: ____________ Movements: ____________ Date: ____________ Start time: ____________ Stop time: ____________ Movements: ____________ Date: ____________ Start time: ____________ Stop time: ____________ Movements: ____________ Date: ____________ Start time: ____________ Stop time: ____________ Movements: ____________ Date: ____________ Start time: ____________ Stop time: ____________ Movements:  This information is not intended to replace advice given to you by your health care provider. Make sure you discuss any questions you have with your health care provider. Document Released: 02/08/2006 Document Revised: 09/08/2015 Document Reviewed: 02/18/2015 Elsevier Interactive Patient Education  2018 Elsevier Inc.  

## 2017-06-13 NOTE — Progress Notes (Signed)
ROB, problem visit , PT presents for swelling in both legs and headache. She has not taken any tylenol. Pt encouraged to take and PO hydration. She has +2 pitting edema bilaterally. No clonus, reflexes +1 right +2 left leg. Denies visual changes and is unsure about epigastric pain. States that she has some abdomen pain, difficult to tell if it is from baby movement or contractions. Labs today. Red flag symptoms reviewed. Will follow up with result. Return Tuesday for BPP , ROB and will schedule induction for 41 wks.   Doreene Burke, CNM

## 2017-06-14 ENCOUNTER — Other Ambulatory Visit: Payer: Self-pay | Admitting: Certified Nurse Midwife

## 2017-06-14 DIAGNOSIS — O480 Post-term pregnancy: Secondary | ICD-10-CM

## 2017-06-14 LAB — CBC WITH DIFFERENTIAL/PLATELET
BASOS: 0 %
Basophils Absolute: 0 10*3/uL (ref 0.0–0.2)
EOS (ABSOLUTE): 0.1 10*3/uL (ref 0.0–0.4)
Eos: 1 %
HEMOGLOBIN: 12 g/dL (ref 11.1–15.9)
Hematocrit: 33.8 % — ABNORMAL LOW (ref 34.0–46.6)
IMMATURE GRANS (ABS): 0 10*3/uL (ref 0.0–0.1)
Immature Granulocytes: 0 %
LYMPHS: 24 %
Lymphocytes Absolute: 2 10*3/uL (ref 0.7–3.1)
MCH: 28.4 pg (ref 26.6–33.0)
MCHC: 35.5 g/dL (ref 31.5–35.7)
MCV: 80 fL (ref 79–97)
MONOCYTES: 11 %
Monocytes Absolute: 0.9 10*3/uL (ref 0.1–0.9)
NEUTROS ABS: 5.5 10*3/uL (ref 1.4–7.0)
Neutrophils: 64 %
Platelets: 170 10*3/uL (ref 150–450)
RBC: 4.22 x10E6/uL (ref 3.77–5.28)
RDW: 13 % (ref 12.3–15.4)
WBC: 8.5 10*3/uL (ref 3.4–10.8)

## 2017-06-14 LAB — COMPREHENSIVE METABOLIC PANEL
A/G RATIO: 1.7 (ref 1.2–2.2)
ALT: 21 IU/L (ref 0–32)
AST: 23 IU/L (ref 0–40)
Albumin: 3.9 g/dL (ref 3.5–5.5)
Alkaline Phosphatase: 115 IU/L (ref 39–117)
BUN / CREAT RATIO: 9 (ref 9–23)
BUN: 5 mg/dL — AB (ref 6–20)
Bilirubin Total: 0.4 mg/dL (ref 0.0–1.2)
CO2: 19 mmol/L — ABNORMAL LOW (ref 20–29)
CREATININE: 0.55 mg/dL — AB (ref 0.57–1.00)
Calcium: 9.6 mg/dL (ref 8.7–10.2)
Chloride: 104 mmol/L (ref 96–106)
GFR calc non Af Amer: 136 mL/min/{1.73_m2} (ref >59)
GFR, EST AFRICAN AMERICAN: 157 mL/min/{1.73_m2} (ref >59)
GLUCOSE: 59 mg/dL — AB (ref 65–99)
Globulin, Total: 2.3 g/dL (ref 1.5–4.5)
Potassium: 4.2 mmol/L (ref 3.5–5.2)
SODIUM: 139 mmol/L (ref 134–144)
TOTAL PROTEIN: 6.2 g/dL (ref 6.0–8.5)

## 2017-06-14 LAB — PROTEIN / CREATININE RATIO, URINE
Creatinine, Urine: 52.3 mg/dL
Protein, Ur: 18.4 mg/dL
Protein/Creat Ratio: 352 mg/g creat — ABNORMAL HIGH (ref 0–200)

## 2017-06-15 ENCOUNTER — Inpatient Hospital Stay: Payer: Medicaid Other | Admitting: Anesthesiology

## 2017-06-15 ENCOUNTER — Inpatient Hospital Stay
Admission: EM | Admit: 2017-06-15 | Discharge: 2017-06-17 | DRG: 807 | Disposition: A | Discharge status: Home / Self Care | Payer: Medicaid Other | Attending: Certified Nurse Midwife | Admitting: Certified Nurse Midwife

## 2017-06-15 ENCOUNTER — Encounter: Payer: Medicaid Other | Admitting: Certified Nurse Midwife

## 2017-06-15 ENCOUNTER — Other Ambulatory Visit: Payer: Self-pay

## 2017-06-15 DIAGNOSIS — Z3A40 40 weeks gestation of pregnancy: Secondary | ICD-10-CM

## 2017-06-15 DIAGNOSIS — F329 Major depressive disorder, single episode, unspecified: Secondary | ICD-10-CM | POA: Diagnosis present

## 2017-06-15 DIAGNOSIS — Z3483 Encounter for supervision of other normal pregnancy, third trimester: Secondary | ICD-10-CM | POA: Diagnosis present

## 2017-06-15 DIAGNOSIS — J45909 Unspecified asthma, uncomplicated: Secondary | ICD-10-CM | POA: Diagnosis present

## 2017-06-15 DIAGNOSIS — O99344 Other mental disorders complicating childbirth: Secondary | ICD-10-CM | POA: Diagnosis present

## 2017-06-15 DIAGNOSIS — O99824 Streptococcus B carrier state complicating childbirth: Secondary | ICD-10-CM | POA: Diagnosis present

## 2017-06-15 DIAGNOSIS — O9952 Diseases of the respiratory system complicating childbirth: Secondary | ICD-10-CM | POA: Diagnosis present

## 2017-06-15 DIAGNOSIS — O1404 Mild to moderate pre-eclampsia, complicating childbirth: Secondary | ICD-10-CM | POA: Diagnosis present

## 2017-06-15 LAB — URINE DRUG SCREEN, QUALITATIVE (ARMC ONLY)
Amphetamines, Ur Screen: NOT DETECTED
BENZODIAZEPINE, UR SCRN: NOT DETECTED
Barbiturates, Ur Screen: NOT DETECTED
CANNABINOID 50 NG, UR ~~LOC~~: NOT DETECTED
COCAINE METABOLITE, UR ~~LOC~~: NOT DETECTED
MDMA (Ecstasy)Ur Screen: NOT DETECTED
Methadone Scn, Ur: NOT DETECTED
OPIATE, UR SCREEN: NOT DETECTED
PHENCYCLIDINE (PCP) UR S: NOT DETECTED
Tricyclic, Ur Screen: NOT DETECTED

## 2017-06-15 LAB — TYPE AND SCREEN
ABO/RH(D): A POS
Antibody Screen: NEGATIVE

## 2017-06-15 LAB — CBC
HEMATOCRIT: 36.6 % (ref 35.0–47.0)
Hemoglobin: 12.9 g/dL (ref 12.0–16.0)
MCH: 28.5 pg (ref 26.0–34.0)
MCHC: 35.3 g/dL (ref 32.0–36.0)
MCV: 80.7 fL (ref 80.0–100.0)
PLATELETS: 203 10*3/uL (ref 150–440)
RBC: 4.54 MIL/uL (ref 3.80–5.20)
RDW: 13.4 % (ref 11.5–14.5)
WBC: 13.3 10*3/uL — AB (ref 3.6–11.0)

## 2017-06-15 MED ORDER — SIMETHICONE 80 MG PO CHEW: 80.0000 mg | CHEWABLE_TABLET | ORAL | Status: DC | PRN

## 2017-06-15 MED ORDER — ONDANSETRON HCL 4 MG/2ML IJ SOLN
4.0000 mg | Freq: Four times a day (QID) | INTRAMUSCULAR | Status: DC | PRN
  Administered 2017-06-15: 4 mg via INTRAVENOUS
  Filled 2017-06-15: qty 2

## 2017-06-15 MED ORDER — SENNOSIDES-DOCUSATE SODIUM 8.6-50 MG PO TABS
2.0000 | ORAL_TABLET | ORAL | Status: DC
  Administered 2017-06-16 – 2017-06-17 (×2): 2 via ORAL
  Filled 2017-06-15 (×3): qty 2

## 2017-06-15 MED ORDER — MISOPROSTOL 200 MCG PO TABS
ORAL_TABLET | ORAL | Status: AC
  Filled 2017-06-15: qty 4

## 2017-06-15 MED ORDER — OXYTOCIN BOLUS FROM INFUSION
500.0000 mL | Freq: Once | INTRAVENOUS | Status: AC
  Administered 2017-06-15: 500 mL via INTRAVENOUS

## 2017-06-15 MED ORDER — DIBUCAINE 1 % RE OINT: 1.0000 "application " | TOPICAL_OINTMENT | RECTAL | Status: DC | PRN

## 2017-06-15 MED ORDER — AMMONIA AROMATIC IN INHA
RESPIRATORY_TRACT | Status: AC
  Filled 2017-06-15: qty 10

## 2017-06-15 MED ORDER — LIDOCAINE HCL (PF) 1 % IJ SOLN
INTRAMUSCULAR | Status: AC
  Filled 2017-06-15: qty 30

## 2017-06-15 MED ORDER — LIDOCAINE-EPINEPHRINE (PF) 1.5 %-1:200000 IJ SOLN
INTRAMUSCULAR | Status: DC | PRN
  Administered 2017-06-15: 4 mL via EPIDURAL

## 2017-06-15 MED ORDER — BENZOCAINE-MENTHOL 20-0.5 % EX AERO
1.0000 "application " | INHALATION_SPRAY | CUTANEOUS | Status: DC | PRN
  Filled 2017-06-15: qty 56

## 2017-06-15 MED ORDER — SODIUM CHLORIDE 0.9 % IV SOLN
2.0000 g | Freq: Once | INTRAVENOUS | Status: AC
  Administered 2017-06-15: 2 g via INTRAVENOUS
  Filled 2017-06-15: qty 2000

## 2017-06-15 MED ORDER — ACETAMINOPHEN 325 MG PO TABS
650.0000 mg | ORAL_TABLET | ORAL | Status: DC | PRN
  Administered 2017-06-16 (×2): 650 mg via ORAL
  Filled 2017-06-15 (×2): qty 2

## 2017-06-15 MED ORDER — FENTANYL 2.5 MCG/ML W/ROPIVACAINE 0.15% IN NS 100 ML EPIDURAL (ARMC)
EPIDURAL | Status: AC
  Filled 2017-06-15: qty 100

## 2017-06-15 MED ORDER — OXYTOCIN 40 UNITS IN LACTATED RINGERS INFUSION - SIMPLE MED
INTRAVENOUS | Status: AC
  Filled 2017-06-15: qty 1000

## 2017-06-15 MED ORDER — SODIUM CHLORIDE FLUSH 0.9 % IV SOLN
INTRAVENOUS | Status: AC
  Filled 2017-06-15: qty 10

## 2017-06-15 MED ORDER — OXYTOCIN 10 UNIT/ML IJ SOLN: 10.0000 [IU] | Freq: Once | INTRAMUSCULAR | Status: DC

## 2017-06-15 MED ORDER — ONDANSETRON HCL 4 MG PO TABS: 4.0000 mg | ORAL_TABLET | ORAL | Status: DC | PRN

## 2017-06-15 MED ORDER — OXYTOCIN 40 UNITS IN LACTATED RINGERS INFUSION - SIMPLE MED
1.0000 m[IU]/min | INTRAVENOUS | Status: DC
  Administered 2017-06-15: 2 m[IU]/min via INTRAVENOUS
  Filled 2017-06-15: qty 1000

## 2017-06-15 MED ORDER — WITCH HAZEL-GLYCERIN EX PADS: 1.0000 "application " | MEDICATED_PAD | CUTANEOUS | Status: DC | PRN

## 2017-06-15 MED ORDER — BUPIVACAINE HCL (PF) 0.25 % IJ SOLN
INTRAMUSCULAR | Status: DC | PRN
  Administered 2017-06-15: 4 mL via EPIDURAL
  Administered 2017-06-15: 2 mL via EPIDURAL

## 2017-06-15 MED ORDER — ACETAMINOPHEN 325 MG PO TABS: 650.0000 mg | ORAL_TABLET | ORAL | Status: DC | PRN

## 2017-06-15 MED ORDER — COCONUT OIL OIL
1.0000 "application " | TOPICAL_OIL | Status: DC | PRN
  Filled 2017-06-15: qty 120

## 2017-06-15 MED ORDER — DOCUSATE SODIUM 100 MG PO CAPS
100.0000 mg | ORAL_CAPSULE | Freq: Two times a day (BID) | ORAL | Status: DC
  Administered 2017-06-16 – 2017-06-17 (×4): 100 mg via ORAL
  Filled 2017-06-15 (×4): qty 1

## 2017-06-15 MED ORDER — PHENYLEPHRINE 40 MCG/ML (10ML) SYRINGE FOR IV PUSH (FOR BLOOD PRESSURE SUPPORT): 80.0000 ug | PREFILLED_SYRINGE | INTRAVENOUS | Status: DC | PRN

## 2017-06-15 MED ORDER — DIPHENHYDRAMINE HCL 50 MG/ML IJ SOLN
12.5000 mg | INTRAMUSCULAR | Status: DC | PRN
  Administered 2017-06-15: 12.5 mg via INTRAVENOUS
  Filled 2017-06-15: qty 1

## 2017-06-15 MED ORDER — METHYLERGONOVINE MALEATE 0.2 MG PO TABS
0.2000 mg | ORAL_TABLET | ORAL | Status: DC | PRN
  Filled 2017-06-15: qty 1

## 2017-06-15 MED ORDER — OXYCODONE-ACETAMINOPHEN 5-325 MG PO TABS
2.0000 | ORAL_TABLET | ORAL | Status: DC | PRN
  Administered 2017-06-17: 2 via ORAL
  Filled 2017-06-15: qty 2

## 2017-06-15 MED ORDER — SOD CITRATE-CITRIC ACID 500-334 MG/5ML PO SOLN: 30.0000 mL | ORAL | Status: DC | PRN

## 2017-06-15 MED ORDER — METHYLERGONOVINE MALEATE 0.2 MG/ML IJ SOLN
0.2000 mg | INTRAMUSCULAR | Status: DC | PRN
  Administered 2017-06-15: 0.2 mg via INTRAMUSCULAR

## 2017-06-15 MED ORDER — IBUPROFEN 600 MG PO TABS
600.0000 mg | ORAL_TABLET | Freq: Four times a day (QID) | ORAL | Status: DC
  Administered 2017-06-16 – 2017-06-17 (×6): 600 mg via ORAL
  Filled 2017-06-15 (×7): qty 1

## 2017-06-15 MED ORDER — PRENATAL MULTIVITAMIN CH
1.0000 | ORAL_TABLET | Freq: Every day | ORAL | Status: DC
  Administered 2017-06-16 – 2017-06-17 (×2): 1 via ORAL
  Filled 2017-06-15 (×2): qty 1

## 2017-06-15 MED ORDER — LACTATED RINGERS IV SOLN: 500.0000 mL | Freq: Once | INTRAVENOUS | Status: DC

## 2017-06-15 MED ORDER — BUTORPHANOL TARTRATE 2 MG/ML IJ SOLN: 1.0000 mg | INTRAMUSCULAR | Status: DC | PRN

## 2017-06-15 MED ORDER — EPHEDRINE 5 MG/ML INJ: 10.0000 mg | INTRAVENOUS | Status: DC | PRN

## 2017-06-15 MED ORDER — SODIUM CHLORIDE 0.9 % IV SOLN
1.0000 g | INTRAVENOUS | Status: DC
  Administered 2017-06-15 (×2): 1 g via INTRAVENOUS
  Filled 2017-06-15 (×3): qty 1000

## 2017-06-15 MED ORDER — FENTANYL CITRATE (PF) 100 MCG/2ML IJ SOLN
50.0000 ug | Freq: Once | INTRAMUSCULAR | Status: AC
  Administered 2017-06-15: 50 ug via INTRAVENOUS
  Filled 2017-06-15: qty 2

## 2017-06-15 MED ORDER — FENTANYL 2.5 MCG/ML W/ROPIVACAINE 0.15% IN NS 100 ML EPIDURAL (ARMC)
12.0000 mL/h | EPIDURAL | Status: DC
  Administered 2017-06-15 (×2): 12 mL/h via EPIDURAL
  Filled 2017-06-15: qty 100

## 2017-06-15 MED ORDER — TETANUS-DIPHTH-ACELL PERTUSSIS 5-2.5-18.5 LF-MCG/0.5 IM SUSP
0.5000 mL | Freq: Once | INTRAMUSCULAR | Status: DC
  Filled 2017-06-15: qty 0.5

## 2017-06-15 MED ORDER — LACTATED RINGERS IV SOLN
INTRAVENOUS | Status: DC
  Administered 2017-06-15 – 2017-06-16 (×2): via INTRAVENOUS

## 2017-06-15 MED ORDER — IBUPROFEN 100 MG/5ML PO SUSP
800.0000 mg | Freq: Three times a day (TID) | ORAL | Status: DC
  Administered 2017-06-15: 800 mg via ORAL
  Filled 2017-06-15 (×7): qty 40

## 2017-06-15 MED ORDER — LACTATED RINGERS IV SOLN: 500.0000 mL | INTRAVENOUS | Status: DC | PRN

## 2017-06-15 MED ORDER — ONDANSETRON HCL 4 MG/2ML IJ SOLN: 4.0000 mg | INTRAMUSCULAR | Status: DC | PRN

## 2017-06-15 MED ORDER — LIDOCAINE HCL (PF) 1 % IJ SOLN
30.0000 mL | INTRAMUSCULAR | Status: AC | PRN
  Administered 2017-06-15: 3 mL via SUBCUTANEOUS

## 2017-06-15 MED ORDER — OXYCODONE-ACETAMINOPHEN 5-325 MG PO TABS
1.0000 | ORAL_TABLET | ORAL | Status: DC | PRN
  Administered 2017-06-15 – 2017-06-17 (×2): 1 via ORAL
  Filled 2017-06-15 (×2): qty 1

## 2017-06-15 MED ORDER — TERBUTALINE SULFATE 1 MG/ML IJ SOLN: 0.2500 mg | Freq: Once | INTRAMUSCULAR | Status: DC | PRN

## 2017-06-15 MED ORDER — OXYTOCIN 40 UNITS IN LACTATED RINGERS INFUSION - SIMPLE MED
2.5000 [IU]/h | INTRAVENOUS | Status: DC
  Administered 2017-06-15: 2.5 [IU]/h via INTRAVENOUS

## 2017-06-15 MED ORDER — METHYLERGONOVINE MALEATE 0.2 MG/ML IJ SOLN
INTRAMUSCULAR | Status: AC
  Administered 2017-06-15: 0.2 mg
  Filled 2017-06-15: qty 1

## 2017-06-15 MED ORDER — FERROUS SULFATE 325 (65 FE) MG PO TABS
325.0000 mg | ORAL_TABLET | Freq: Every day | ORAL | Status: DC
  Administered 2017-06-16 – 2017-06-17 (×2): 325 mg via ORAL
  Filled 2017-06-15 (×2): qty 1

## 2017-06-15 MED ORDER — OXYTOCIN 10 UNIT/ML IJ SOLN
INTRAMUSCULAR | Status: AC
  Filled 2017-06-15: qty 2

## 2017-06-15 MED ORDER — HYDROCODONE-ACETAMINOPHEN 7.5-325 MG/15ML PO SOLN
15.0000 mL | Freq: Four times a day (QID) | ORAL | Status: DC | PRN
  Administered 2017-06-15: 15 mL via ORAL
  Filled 2017-06-15 (×2): qty 15

## 2017-06-15 NOTE — OB Triage Note (Signed)
Patient came in for observation for labor evaluation. Patient reports uterine contractions every three minutes since 0100. Patient rates pain 9/10. Patient reports + FM upon arrival. Patient denies leaking of fluid, denies vaginal bleeding and spotting. Vital signs stable and patient afebrile. FHR baseline 130 with moderate variability with accelerations 15 x 15 and no decelerations. Significant other at bedside. Will continue to monitor.

## 2017-06-15 NOTE — Anesthesia Procedure Notes (Signed)
Epidural Patient location during procedure: OB  Staffing Performed: anesthesiologist   Preanesthetic Checklist Completed: patient identified, site marked, surgical consent, pre-op evaluation, timeout performed, IV checked, risks and benefits discussed and monitors and equipment checked  Epidural Patient position: sitting Prep: Betadine Patient monitoring: heart rate, continuous pulse ox and blood pressure Approach: midline Location: L3-L4 Injection technique: LOR saline  Needle:  Needle type: Tuohy  Needle gauge: 17 G Needle length: 9 cm and 9 Needle insertion depth: 4 cm Catheter type: closed end flexible Catheter size: 19 Gauge Catheter at skin depth: 12 cm Test dose: negative and 1.5% lidocaine with Epi 1:200 K  Assessment Sensory level: T10 Events: blood not aspirated, injection not painful, no injection resistance, negative IV test and no paresthesia  Additional Notes   Patient tolerated the insertion well without complications.-SATD -IVTD. No paresthesia. Refer to [facility] nursing for VS and dosingReason for block:procedure for pain

## 2017-06-15 NOTE — Progress Notes (Signed)
LABOR NOTE   Shelby Kelly 21 y.o.GP@ at [redacted]w[redacted]d Active phase labor.  SUBJECTIVE:  Comfortable with epidural OBJECTIVE:  BP 130/86   Pulse 86   Temp 98.9 F (37.2 C) (Oral)   Resp 18   Ht  (1.499 m)   Wt 125 lb (56.7 kg)   LMP 09/06/2016 (Approximate)   SpO2 100%   BMI 25.25 kg/m  Total I/O In: -  Out: 275 [Urine:275]  She has shown cervical change. CERVIX: 8cm:  90:   -1:   mid position:   soft SVE:   Dilation: 8 Effacement (%): 90 Station: Plus 1 Exam by:: Shelby Kelly CNM CONTRACTIONS: irregular, every 1-4 minutes FHR: Fetal heart tracing reviewed. Baseline: 135 bpm, Variability: Good {> 6 bpm), Accelerations: Reactive and Decelerations: Early Category I   Analgesia: Epidural  Labs: Lab Results  Component Value Date   WBC 13.3 (H) 06/15/2017   HGB 12.9 06/15/2017   HCT 36.6 06/15/2017   MCV 80.7 06/15/2017   PLT 203 06/15/2017    ASSESSMENT: 1) Labor curve reviewed.       Progress: Active phase labor.     Membranes: ruptured, clear fluid     IUPC placed  Active Problems:   Labor and delivery, indication for care   PLAN: IV Pitocin augmentation and increase Pitocin rate to adequate MVU's  200-250  Shelby Kelly, CNM  06/15/2017 1:13 PM

## 2017-06-15 NOTE — Anesthesia Preprocedure Evaluation (Signed)
Anesthesia Evaluation  Patient identified by MRN, date of birth, ID band Patient awake    Reviewed: Allergy & Precautions, H&P , NPO status , Patient's Chart, lab work & pertinent test results, reviewed documented beta blocker date and time   Airway Mallampati: II  TM Distance: >3 FB Neck ROM: full    Dental no notable dental hx. (+) Teeth Intact   Pulmonary neg pulmonary ROS, Current Smoker, former smoker,    Pulmonary exam normal breath sounds clear to auscultation       Cardiovascular Exercise Tolerance: Good negative cardio ROS   Rhythm:regular Rate:Normal     Neuro/Psych negative neurological ROS  negative psych ROS   GI/Hepatic negative GI ROS, Neg liver ROS,   Endo/Other  negative endocrine ROSdiabetes  Renal/GU      Musculoskeletal   Abdominal   Peds  Hematology negative hematology ROS (+)   Anesthesia Other Findings   Reproductive/Obstetrics (+) Pregnancy                             Anesthesia Physical Anesthesia Plan  ASA: II  Anesthesia Plan: Epidural   Post-op Pain Management:    Induction:   PONV Risk Score and Plan:   Airway Management Planned:   Additional Equipment:   Intra-op Plan:   Post-operative Plan:   Informed Consent: I have reviewed the patients History and Physical, chart, labs and discussed the procedure including the risks, benefits and alternatives for the proposed anesthesia with the patient or authorized representative who has indicated his/her understanding and acceptance.       Plan Discussed with:   Anesthesia Plan Comments:         Anesthesia Quick Evaluation  

## 2017-06-15 NOTE — Progress Notes (Signed)
LABOR NOTE   Shelby Kelly 22 y.o.GP@ at [redacted]w[redacted]d Active phase labor.  SUBJECTIVE:  Comfortable with epidural  OBJECTIVE:  BP 114/85   Pulse 73   Temp 98.9 F (37.2 C) (Oral)   Resp 18   Ht  (1.499 m)   Wt 125 lb (56.7 kg)   LMP 09/06/2016 (Approximate)   SpO2 100%   BMI 25.25 kg/m  No intake/output data recorded.  She has shown cervical change. CERVIX: 7.5cm:   0:   mid position:   soft SVE:   Dilation: 7 Effacement (%): 100 Station: 0 Exam by:: M Trogdon RN CONTRACTIONS: irregular, every 2-4 minutes FHR: Fetal heart tracing reviewed. Baseline: 130 bpm, Variability: Good {> 6 bpm), Accelerations: Reactive and Decelerations: Absent Category I   Analgesia: Epidural  Labs: Lab Results  Component Value Date   WBC 13.3 (H) 06/15/2017   HGB 12.9 06/15/2017   HCT 36.6 06/15/2017   MCV 80.7 06/15/2017   PLT 203 06/15/2017    ASSESSMENT: 1) Labor curve reviewed.       Progress: Active phase labor.     Membranes: ruptured   Active Problems:   Labor and delivery, indication for care   PLAN: IV Pitocin augmentation  Doreene Burke, CNM  06/15/2017 11:33 AM
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
## 2017-06-15 NOTE — Progress Notes (Signed)
LABOR NOTE   Shelby Kelly 21 y.o.GP@ at [redacted]w[redacted]d Active phase labor.  SUBJECTIVE:  Comfortable with epidural  OBJECTIVE:  BP (!) 134/92   Pulse 72   Temp 98.7 F (37.1 C) (Oral)   Resp 18   Ht  (1.499 m)   Wt 125 lb (56.7 kg)   LMP 09/06/2016 (Approximate)   SpO2 100%   BMI 25.25 kg/m  Total I/O In: -  Out: 425 [Urine:425]  She has shown cervical change. CERVIX: anterior lip: +2 station SVE:   Dilation: Lip/rim Effacement (%): 100 Station: Plus 2 Exam by:: Janee Morn CNM CONTRACTIONS: regular, every 1-3 minutes FHR: Fetal heart tracing reviewed. Baseline: 130 bpm, Variability: Good {> 6 bpm), Accelerations: Reactive and Decelerations: Early Category I  Analgesia: Epidural  Labs: Lab Results  Component Value Date   WBC 13.3 (H) 06/15/2017   HGB 12.9 06/15/2017   HCT 36.6 06/15/2017   MCV 80.7 06/15/2017   PLT 203 06/15/2017    ASSESSMENT: 1) Labor curve reviewed.       Progress: Active phase labor.     Membranes: ruptured, clear fluid       Active Problems:   Labor and delivery, indication for care   PLAN: attempt to to reduce anterior lip with pushing  Doreene Burke, CNM  06/15/2017 4:45 PM

## 2017-06-15 NOTE — Discharge Summary (Signed)
Obstetric Discharge Summary   Patient ID: Shelby Kelly MRN: [id number] DOB/AGE: 1995/03/16 22 y.o.   Date of Admission: 06/11/2017 Serafina Royals, CNM Charlena Cross, MD)  Date of Discharge: 06/11/2017 Serafina Royals, CNM Charlena Cross, MD)  Admitting Diagnosis: Observation at [redacted]w[redacted]d     Discharge Diagnosis: No other diagnosis   Antepartum Procedures: NST   Brief Hospital Course   L&D OB Triage Note  Shelby Kelly is a 22 y.o. G1P0000 female at [redacted]w[redacted]d, EDD Estimated Date of Delivery: 06/13/17 who presented to triage for complaints of uterine contractions.  She was evaluated by the nurses with no significant findings for labor or fetal distress. Vital signs stable. An NST was performed and has been reviewed by CNM.   NST INTERPRETATION: Indications: rule out uterine contractions  Mode: External Baseline Rate (A): 135 bpm Variability: Moderate Accelerations: 15 x 15 Decelerations: None Contraction Frequency (min): 6-7   Dilation: 2 Effacement (%): 40 Station: -2 Exam by:: Mayo Ao  Impression: reactive   Plan: NST performed was reviewed and was found to be reactive. She was discharged home with bleeding/labor precautions.  Continue routine prenatal care. Follow up with CNM as previously scheduled.   Discharge Instructions: Per After Visit Summary.  Activity: Refer to After Visit Summary.  Diet: Regular  Medications: Allergies as of 06/11/2017      Reactions   Aspergillus (mixed) Allergy Skin Test Hives, Itching, Shortness Of Breath, Swelling      Medication List    ASK your doctor about these medications   ASMANEX HFA 100 MCG/ACT Aero Generic drug:  Mometasone Furoate Inhale 2 puffs into the lungs every 12 (twelve) hours.   cyclobenzaprine 5 MG tablet Commonly known as:  FLEXERIL Take 1 tablet (5 mg total) by mouth 3 (three) times daily as needed for muscle spasms.   metroNIDAZOLE 0.75 % vaginal gel Commonly known as:  METROGEL Place 1 Applicatorful  vaginally at bedtime. Apply one applicatorful to vagina at bedtime for 5 days   prenatal multivitamin Tabs tablet Take 1 tablet by mouth daily at 12 noon.   PROAIR HFA 108 (90 Base) MCG/ACT inhaler Generic drug:  albuterol INHALE 2 PUFFS Q 4 - 6 H PRN   albuterol (2.5 MG/3ML) 0.083% nebulizer solution Commonly known as:  PROVENTIL Take 3 mLs (2.5 mg total) by nebulization every 4 (four) hours as needed for wheezing or shortness of breath.   sertraline 25 MG tablet Commonly known as:  ZOLOFT Take 1 tablet (25 mg total) by mouth daily.      Outpatient follow up:   Discharged Condition: stable  Discharged to: home   Gunnar Bulla, CNM [facility]

## 2017-06-16 LAB — CBC
HCT: 32.5 % — ABNORMAL LOW (ref 35.0–47.0)
HCT: 32.2 % — ABNORMAL LOW (ref 35.0–47.0)
Hemoglobin: 11.4 g/dL — ABNORMAL LOW (ref 12.0–16.0)
Hemoglobin: 11.2 g/dL — ABNORMAL LOW (ref 12.0–16.0)
MCH: 27.9 pg (ref 26.0–34.0)
MCH: 28.3 pg (ref 26.0–34.0)
MCHC: 35.1 g/dL (ref 32.0–36.0)
MCHC: 34.8 g/dL (ref 32.0–36.0)
MCV: 79.5 fL — AB (ref 80.0–100.0)
MCV: 81.4 fL (ref 80.0–100.0)
PLATELETS: 212 10*3/uL (ref 150–440)
Platelets: 171 10*3/uL (ref 150–440)
RBC: 4.09 MIL/uL (ref 3.80–5.20)
RBC: 3.96 MIL/uL (ref 3.80–5.20)
RDW: 13.7 % (ref 11.5–14.5)
RDW: 13.6 % (ref 11.5–14.5)
WBC: 24.4 10*3/uL — AB (ref 3.6–11.0)
WBC: 18.9 10*3/uL — AB (ref 3.6–11.0)

## 2017-06-16 LAB — COMPREHENSIVE METABOLIC PANEL
ALT: 31 U/L (ref 14–54)
ALT: 32 U/L (ref 14–54)
ANION GAP: 8 (ref 5–15)
AST: 65 U/L — AB (ref 15–41)
AST: 58 U/L — AB (ref 15–41)
Albumin: 2.6 g/dL — ABNORMAL LOW (ref 3.5–5.0)
Albumin: 2.8 g/dL — ABNORMAL LOW (ref 3.5–5.0)
Alkaline Phosphatase: 87 U/L (ref 38–126)
Alkaline Phosphatase: 89 U/L (ref 38–126)
Anion gap: 5 (ref 5–15)
BILIRUBIN TOTAL: 0.7 mg/dL (ref 0.3–1.2)
BUN: 10 mg/dL (ref 6–20)
BUN: 10 mg/dL (ref 6–20)
CHLORIDE: 105 mmol/L (ref 101–111)
CHLORIDE: 105 mmol/L (ref 101–111)
CO2: 23 mmol/L (ref 22–32)
CO2: 26 mmol/L (ref 22–32)
Calcium: 8.6 mg/dL — ABNORMAL LOW (ref 8.9–10.3)
Calcium: 8.7 mg/dL — ABNORMAL LOW (ref 8.9–10.3)
Creatinine, Ser: 0.72 mg/dL (ref 0.44–1.00)
Creatinine, Ser: 0.76 mg/dL (ref 0.44–1.00)
Glucose, Bld: 117 mg/dL — ABNORMAL HIGH (ref 65–99)
Glucose, Bld: 80 mg/dL (ref 65–99)
POTASSIUM: 3.9 mmol/L (ref 3.5–5.1)
POTASSIUM: 3.9 mmol/L (ref 3.5–5.1)
SODIUM: 136 mmol/L (ref 135–145)
Sodium: 136 mmol/L (ref 135–145)
TOTAL PROTEIN: 5.6 g/dL — AB (ref 6.5–8.1)
Total Bilirubin: 0.7 mg/dL (ref 0.3–1.2)
Total Protein: 6 g/dL — ABNORMAL LOW (ref 6.5–8.1)

## 2017-06-16 LAB — PROTEIN / CREATININE RATIO, URINE
Creatinine, Urine: 52 mg/dL
Protein Creatinine Ratio: 0.81 mg/mg{Cre} — ABNORMAL HIGH (ref 0.00–0.15)
TOTAL PROTEIN, URINE: 42 mg/dL

## 2017-06-16 LAB — RPR: RPR: NONREACTIVE

## 2017-06-16 MED ORDER — IBUPROFEN 100 MG/5ML PO SUSP: 800.0000 mg | Freq: Three times a day (TID) | ORAL | 1 refills | Status: DC

## 2017-06-16 MED ORDER — NORETHINDRONE 0.35 MG PO TABS: 1.0000 | ORAL_TABLET | Freq: Every day | ORAL | 11 refills | Status: DC

## 2017-06-16 MED ORDER — DOCUSATE SODIUM 100 MG PO CAPS: 100.0000 mg | ORAL_CAPSULE | Freq: Two times a day (BID) | ORAL | 0 refills | Status: DC

## 2017-06-16 NOTE — Final Progress Note (Signed)
Discharge Day SOAP Note:  Progress Note - Vaginal Delivery  Shelby Kelly is a 22 y.o. G1P1001 now PP day 1 s/p Vaginal, Spontaneous . Delivery was complicated by pre-eclampsia without sever features  Subjective  The patient has the following complaints: has no unusual complaints  Pain is controlled with current medications.   Patient is urinating without difficulty.  She is ambulating well.    Objective  Vital signs: BP 109/79 (BP Location: Right Arm)   Pulse 71   Temp 97.9 F (36.6 C) (Oral)   Resp 19   Ht  (1.499 m)   Wt 125 lb (56.7 kg)   LMP 09/06/2016 (Approximate)   SpO2 98%   Breastfeeding? Unknown   BMI 25.25 kg/m   Physical Exam: Gen: NAD Lungs clear bilaterally Heart: RRR Bowel sound present Fundus Fundal Tone: Firm  Lochia Amount: Small  Perineum Appearance: Intact  Reflexes 2+ bilaterally- lower extremities Negative clonus, no pain , or tenderness in calves No visual changes or headache No epigastric pain    Data Review Labs: CBC Latest Ref Rng & Units 06/16/2017 06/15/2017 06/13/2017  WBC 3.6 - 11.0 K/uL 24.4(H) 13.3(H) 8.5  Hemoglobin 12.0 - 16.0 g/dL 11.4(L) 12.9 12.0  Hematocrit 35.0 - 47.0 % 32.5(L) 36.6 33.8(L)  Platelets 150 - 440 K/uL 171 203 170   A POS  Assessment/Plan  Active Problems:   Labor and delivery, indication for care    Plan for discharge today after 1729 if BP remains stable and she remains asymptomatic.   Discharge Instructions: Per After Visit Summary. Activity: Advance as tolerated. Pelvic rest for 6 weeks.  Also refer to After Visit Summary Diet: Regular Medications: Allergies as of 06/16/2017      Reactions   Aspergillus (mixed) Allergy Skin Test Hives, Itching, Shortness Of Breath, Swelling      Medication List    STOP taking these medications   cyclobenzaprine 5 MG tablet Commonly known as:  FLEXERIL   metroNIDAZOLE 0.75 % vaginal gel Commonly known as:  METROGEL     TAKE  these medications   ASMANEX HFA 100 MCG/ACT Aero Generic drug:  Mometasone Furoate Inhale 2 puffs into the lungs every 12 (twelve) hours.   docusate sodium 100 MG capsule Commonly known as:  COLACE Take 1 capsule (100 mg total) by mouth 2 (two) times daily.   ibuprofen 100 MG/5ML suspension Commonly known as:  ADVIL,MOTRIN Take 40 mLs (800 mg total) by mouth every 8 (eight) hours.   norethindrone 0.35 MG tablet Commonly known as:  MICRONOR,CAMILA,ERRIN Take 1 tablet (0.35 mg total) by mouth daily. Start 4-6 wks postpartum   prenatal multivitamin Tabs tablet Take 1 tablet by mouth daily at 12 noon.   PROAIR HFA 108 (90 Base) MCG/ACT inhaler Generic drug:  albuterol INHALE 2 PUFFS Q 4 - 6 H PRN   albuterol (2.5 MG/3ML) 0.083% nebulizer solution Commonly known as:  PROVENTIL Take 3 mLs (2.5 mg total) by nebulization every 4 (four) hours as needed for wheezing or shortness of breath.   sertraline 25 MG tablet Commonly known as:  ZOLOFT Take 1 tablet (25 mg total) by mouth daily.      Outpatient follow up:  In the office on Tuesday 06/19/17 for BP check Postpartum contraception: Progestin only pill, to start 4-6 wks postpartum  Discharged Condition: good  Discharged to: home  Newborn Data: Disposition:home with mother  Apgars: APGAR (1 MIN): 7   APGAR (5 MINS): 8   APGAR (10 MINS):  Baby Feeding: Bottle and Breast    Doreene Burke, CNM  06/16/2017 8:29 AM

## 2017-06-16 NOTE — Anesthesia Postprocedure Evaluation (Signed)
Anesthesia Post Note  Patient: Shelby Kelly  Procedure(s) Performed: AN AD HOC LABOR EPIDURAL  Patient location during evaluation: Mother Baby Anesthesia Type: Epidural Level of consciousness: awake and alert and oriented Pain management: pain level controlled Vital Signs Assessment: post-procedure vital signs reviewed and stable Respiratory status: spontaneous breathing Cardiovascular status: blood pressure returned to baseline Postop Assessment: no headache and no backache Anesthetic complications: no     Last Vitals:  Vitals:   06/16/17 0641 06/16/17 0749  BP: 104/73 109/79  Pulse: (!) 59 71  Resp:  19  Temp:  36.6 C  SpO2:  98%    Last Pain:  Vitals:   06/16/17 0749  TempSrc: Oral  PainSc:                  Hadden Steig

## 2017-06-16 NOTE — Lactation Note (Signed)
This note was copied from a baby's chart. Lactation Consultation Note  Patient Name: Shelby Kelly [id number] Date: 06/16/2017 Reason for consult: Follow-up assessment;Mother's request;Difficult latch;Primapara;Term;Other (Comment)(Sleepy baby) Mom had not fed [name] in 4 hours.  When asked why, mom reports he had been sleeping and she had visitors.  Demonstrated waking techniques and hand expression and he immediately started rooting for the breast.  He latched with minimal assistance to right breast with good rhythmic sucking and an occasional swallow for 7 to 8 minutes before coming off the breast satiated.  Mom denies discomfort to right nipple.  When [name] was on left breast, he had a more shallow latch and now mom says that nipple is tender.  Hand expressed a small amount of colostrum after breast feed and rubbed into nipple for discomfort.  Coconut oil and comfort gels given and discussed alternating use for nipple tenderness as well.  Shortly after breast feed he had a large meconium stool.  Discussed supply and demand and need to breast feed frequently to bring in mature milk and ensure a plentiful milk supply.  Reviewed normal course of lactation and routine newborn feeding patterns.  Lactation name and number written on white board and encouraged to call for questions, concerns or assistance with waking or feeding [name].       Maternal Data Formula Feeding for Exclusion: No Has patient been taught Hand Expression?: Yes Does the patient have breastfeeding experience prior to this delivery?: No  Feeding Feeding Type: Breast Fed Length of feed: 7 min  LATCH Score Latch: Grasps breast easily, tongue down, lips flanged, rhythmical sucking.  Audible Swallowing: A few with stimulation  Type of Nipple: Everted at rest and after stimulation  Comfort (Breast/Nipple): Filling, red/small blisters or bruises, mild/mod discomfort  Hold (Positioning): Assistance needed to correctly  position infant at breast and maintain latch.  LATCH Score: 7  Interventions Interventions: Breast compression;Support pillows;Comfort gels  Lactation Tools Discussed/Used Tools: Comfort gels WIC Program: Yes   Consult Status Consult Status: Follow-up Date: 06/16/17 Follow-up type: Call as needed    Louis Meckel 06/16/2017, 2:36 PM

## 2017-06-16 NOTE — Discharge Summary (Signed)
Discharge Summary  Date of Admission: 06/15/2017  Date of Discharge: 06/16/2017  Admitting Diagnosis: Onset of Labor at [redacted]w[redacted]d  Mode of Delivery: normal spontaneous vaginal delivery                 Discharge Diagnosis: Preeclampsia (mild)   Intrapartum Procedures: Atificial rupture of membranes, epidural, GBS prophylaxis and pitocin augmentation   Post partum procedures: Methergine  Complications: none                      Discharge Day SOAP Note:  Progress Note - Vaginal Delivery  Shelby Kelly is a 22 y.o. G1P1001 now PP day 1 s/p Vaginal, Spontaneous . Delivery was complicated by pre-eclampsia without sever features  Subjective  The patient has the following complaints: has no unusual complaints  Pain is controlled with current medications.   Patient is urinating without difficulty.  She is ambulating well.    Objective  Vital signs: BP 109/79 (BP Location: Right Arm)   Pulse 71   Temp 97.9 F (36.6 C) (Oral)   Resp 19   Ht  (1.499 m)   Wt 125 lb (56.7 kg)   LMP 09/06/2016 (Approximate)   SpO2 98%   Breastfeeding? Unknown   BMI 25.25 kg/m   Physical Exam: Gen: NAD Lungs clear bilaterally Heart: RRR Bowel sound present Fundus Fundal Tone: Firm  Lochia Amount: Small  Perineum Appearance: Intact  Reflexes 2+ bilaterally- lower extremities Negative clonus, no pain , or tenderness in calves No visual changes or headache No epigastric pain    Data Review Labs: CBC Latest Ref Rng & Units 06/16/2017 06/15/2017 06/13/2017  WBC 3.6 - 11.0 K/uL 24.4(H) 13.3(H) 8.5  Hemoglobin 12.0 - 16.0 g/dL 11.4(L) 12.9 12.0  Hematocrit 35.0 - 47.0 % 32.5(L) 36.6 33.8(L)  Platelets 150 - 440 K/uL 171 203 170   A POS  Assessment/Plan  Active Problems:   Labor and delivery, indication for care    Plan for discharge today after 1729 if BP remains stable and she remains asymptomatic.   Discharge Instructions: Per After Visit  Summary. Activity: Advance as tolerated. Pelvic rest for 6 weeks.  Also refer to After Visit Summary Diet: Regular Medications: Allergies as of 06/16/2017      Reactions   Aspergillus (mixed) Allergy Skin Test Hives, Itching, Shortness Of Breath, Swelling      Medication List    STOP taking these medications   cyclobenzaprine 5 MG tablet Commonly known as:  FLEXERIL   metroNIDAZOLE 0.75 % vaginal gel Commonly known as:  METROGEL     TAKE these medications   ASMANEX HFA 100 MCG/ACT Aero Generic drug:  Mometasone Furoate Inhale 2 puffs into the lungs every 12 (twelve) hours.   docusate sodium 100 MG capsule Commonly known as:  COLACE Take 1 capsule (100 mg total) by mouth 2 (two) times daily.   ibuprofen 100 MG/5ML suspension Commonly known as:  ADVIL,MOTRIN Take 40 mLs (800 mg total) by mouth every 8 (eight) hours.   norethindrone 0.35 MG tablet Commonly known as:  MICRONOR,CAMILA,ERRIN Take 1 tablet (0.35 mg total) by mouth daily. Start 4-6 wks postpartum   prenatal multivitamin Tabs tablet Take 1 tablet by mouth daily at 12 noon.   PROAIR HFA 108 (90 Base) MCG/ACT inhaler Generic drug:  albuterol INHALE 2  PUFFS Q 4 - 6 H PRN   albuterol (2.5 MG/3ML) 0.083% nebulizer solution Commonly known as:  PROVENTIL Take 3 mLs (2.5 mg total) by nebulization every 4 (four) hours as needed for wheezing or shortness of breath.   sertraline 25 MG tablet Commonly known as:  ZOLOFT Take 1 tablet (25 mg total) by mouth daily.      Outpatient follow up:  In the office on Tuesday 06/19/17 for BP check Postpartum contraception: Progestin only pill, to start 4-6 wks postpartum  Discharged Condition: good  Discharged to: home  Newborn Data: Disposition:home with mother  Apgars: APGAR (1 MIN): 7   APGAR (5 MINS): 8   APGAR (10 MINS):    Baby Feeding: Bottle and Breast    Doreene Burke, CNM  06/16/2017 8:29 AM

## 2017-06-17 MED ORDER — VARICELLA VIRUS VACCINE LIVE 1350 PFU/0.5ML IJ SUSR
0.5000 mL | Freq: Once | INTRAMUSCULAR | Status: DC
  Filled 2017-06-17: qty 0.5

## 2017-06-17 NOTE — Final Progress Note (Signed)
Discharge Day SOAP Note:  Progress Note - Vaginal Delivery  Shelby Kelly is a 22 y.o. G1P1001 now PP day 2 s/p Vaginal, Spontaneous . Delivery was complicated by pre-eclampsia without sever features. She was planning on going home yesterday but was delayed due to Fetal indications.  Subjective  The patient has the following complaints: has no unusual complaints  Pain is controlled with current medications.   Patient is urinating without difficulty.  She is ambulating well and passing gas.   Objective  Vital signs: BP 133/94 (BP Location: Right Arm)   Pulse 75   Temp 98.1 F (36.7 C) (Oral)   Resp 20   Ht  (1.499 m)   Wt 125 lb (56.7 kg)   LMP 09/06/2016 (Approximate)   SpO2 98%   Breastfeeding? Unknown   BMI 25.25 kg/m   Physical Exam: Gen: NAD Lungs clear bilaterally Heart: RRR Bowel sound present Fundus Fundal Tone: Firm  Lochia Amount: Small  Perineum Appearance: Intact  Reflexes 2+ bilaterally- lower extremities Negative clonus, no pain , or tenderness in calves No visual changes or headache No epigastric pain               Data Review Labs: CBC Latest Ref Rng & Units 06/16/2017 06/15/2017 06/13/2017  WBC 3.6 - 11.0 K/uL 24.4(H) 13.3(H) 8.5  Hemoglobin 12.0 - 16.0 g/dL 11.4(L) 12.9 12.0  Hematocrit 35.0 - 47.0 % 32.5(L) 36.6 33.8(L)  Platelets 150 - 440 K/uL 171 203 170   A POS  Assessment/Plan  Active Problems:   Labor and delivery, indication for care    Plan for discharge today after 1500 if BP remains stable and she remains asymptomatic.   Discharge Instructions: Per After Visit Summary. Activity: Advance as tolerated. Pelvic rest for 6 weeks.  Also refer to After Visit Summary Diet: Regular Medications:      Allergies as of 06/16/2017      Reactions   Aspergillus (mixed) Allergy Skin Test Hives, Itching, Shortness Of Breath, Swelling         Medication List    STOP taking these medications   cyclobenzaprine  5 MG tablet Commonly known as:  FLEXERIL   metroNIDAZOLE 0.75 % vaginal gel Commonly known as:  METROGEL     TAKE these medications   ASMANEX HFA 100 MCG/ACT Aero Generic drug:  Mometasone Furoate Inhale 2 puffs into the lungs every 12 (twelve) hours.   docusate sodium 100 MG capsule Commonly known as:  COLACE Take 1 capsule (100 mg total) by mouth 2 (two) times daily.   ibuprofen 100 MG/5ML suspension Commonly known as:  ADVIL,MOTRIN Take 40 mLs (800 mg total) by mouth every 8 (eight) hours.   norethindrone 0.35 MG tablet Commonly known as:  MICRONOR,CAMILA,ERRIN Take 1 tablet (0.35 mg total) by mouth daily. Start 4-6 wks postpartum   prenatal multivitamin Tabs tablet Take 1 tablet by mouth daily at 12 noon.   PROAIR HFA 108 (90 Base) MCG/ACT inhaler Generic drug:  albuterol INHALE 2 PUFFS Q 4 - 6 H PRN   albuterol (2.5 MG/3ML) 0.083% nebulizer solution Commonly known as:  PROVENTIL Take 3 mLs (2.5 mg total) by nebulization every 4 (four) hours as needed for wheezing or shortness of breath.   sertraline 25 MG tablet Commonly known as:  ZOLOFT Take 1 tablet (25 mg total) by mouth daily.      Outpatient follow up:  In the office on Tuesday 06/19/17 for BP check Postpartum contraception: Progestin only pill, to start  4-6 wks postpartum  Discharged Condition: good  Discharged to: home  Newborn Data: Disposition:home with mother  Apgars: APGAR (1 MIN): 7   APGAR (5 MINS): 8   APGAR (10 MINS):    Baby Feeding: Bottle and Breast    Doreene Burke, CNM  06/17/2017 11:07 AM

## 2017-06-17 NOTE — Progress Notes (Signed)
Discharge instructions given. Patient verbalizes understanding of teaching. Patient discharged home via wheelchair at 1600. 

## 2017-06-17 NOTE — Lactation Note (Signed)
This note was copied from a baby's chart. Lactation Consultation Note  Mom refusing to put [name] to breast d/t extremely sore nipples.  Mom agrees to pump if not too painful.  Demonstrated set up of Symphony pump and manual pumping.  Explained to FOB how can he can assist with pumping.  Discussed how to hand express, pumping, collection and storage, labeling and cleaning of supplies with both parents.  Mom expressed 8 ml and denied any pain.  Reviewed pumping every 2 to 3 hours or as often as she is feeding baby for stimulation if [name] does not go to the breast.  Lactation name and number is written on white board and encouraged to call for questions, concerns or assistance. Patient Name: Shelby Kelly [id number] Date: 06/17/2017 Reason for consult: Follow-up assessment;Primapara;Term;Nipple pain/trauma   Maternal Data Formula Feeding for Exclusion: No Has patient been taught Hand Expression?: Yes(Hand expressed colostrum easily) Does the patient have breastfeeding experience prior to this delivery?: No  Feeding Feeding Type: Bottle Fed - Formula Nipple Type: Slow - flow Length of feed: 5 min  LATCH Score                   Interventions Interventions: Hand express;Breast compression;Expressed milk;Comfort gels;DEBP  Lactation Tools Discussed/Used Tools: Pump;Comfort gels;Bottle Breast pump type: Double-Electric Breast Pump WIC Program: Yes(Mom says plans to sign up for [facility] MCD) Pump Review: Setup, frequency, and cleaning;Milk Storage;Other (comment) Initiated by:: S.Kaedin Hicklin,RN,BSN,IBCLC Date initiated:: 06/17/17   Consult Status Consult Status: Follow-up Date: 06/17/17 Follow-up type: Call as needed    Louis Meckel 06/17/2017, 11:07 AM

## 2017-06-17 NOTE — Discharge Summary (Signed)
Discharge Summary  Date of Admission: 06/15/2017  Date of Discharge: 06/17/2017  Admitting Diagnosis: Onset of Labor at [redacted]w[redacted]d  Mode of Delivery: normal spontaneous vaginal delivery                                                  Discharge Diagnosis: Preeclampsia (mild)              Intrapartum Procedures: Atificial rupture of membranes, epidural, GBS prophylaxis and pitocin augmentation              Post partum procedures: Methergine  Complications: none       Discharge Day SOAP Note:  Progress Note - Vaginal Delivery  Shelby Kelly is a 22 y.o. G1P1001 now PP day 2 s/p Vaginal, Spontaneous . Delivery was complicated by pre-eclampsia without sever features. She was planning on going home yesterday but was delayed due to Fetal indications.  Subjective  The patient has the following complaints: has no unusual complaints  Pain is controlled with current medications.   Patient is urinating without difficulty.  She is ambulating well and passing gas.   Objective  Vital signs: BP 133/94 (BP Location: Right Arm)   Pulse 75   Temp 98.1 F (36.7 C) (Oral)   Resp 20   Ht  (1.499 m)   Wt 125 lb (56.7 kg)   LMP 09/06/2016 (Approximate)   SpO2 98%   Breastfeeding? Unknown   BMI 25.25 kg/m   Physical Exam: Gen: NAD Lungs clear bilaterally Heart: RRR Bowel sound present Fundus Fundal Tone: Firm  Lochia Amount: Small  Perineum Appearance: Intact  Reflexes 2+ bilaterally- lower extremities Negative clonus, no pain , or tenderness in calves No visual changes or headache No epigastric pain               Data Review Labs: CBC Latest Ref Rng & Units 06/16/2017 06/15/2017 06/13/2017  WBC 3.6 - 11.0 K/uL 24.4(H) 13.3(H) 8.5  Hemoglobin 12.0 - 16.0 g/dL 11.4(L) 12.9 12.0  Hematocrit 35.0 - 47.0 % 32.5(L) 36.6 33.8(L)  Platelets 150 - 440 K/uL 171 203 170   A POS  Assessment/Plan  Active Problems:   Labor and delivery, indication for care     Plan for discharge today after 1500 if BP remains stable and she remains asymptomatic.   Discharge Instructions: Per After Visit Summary. Activity: Advance as tolerated. Pelvic rest for 6 weeks.  Also refer to After Visit Summary Diet: Regular Medications:      Allergies as of 06/16/2017      Reactions   Aspergillus (mixed) Allergy Skin Test Hives, Itching, Shortness Of Breath, Swelling         Medication List    STOP taking these medications   cyclobenzaprine 5 MG tablet Commonly known as:  FLEXERIL   metroNIDAZOLE 0.75 % vaginal gel Commonly known as:  METROGEL     TAKE these medications   ASMANEX HFA 100 MCG/ACT Aero Generic drug:  Mometasone Furoate Inhale 2 puffs into the lungs every 12 (twelve) hours.   docusate sodium 100 MG capsule Commonly known as:  COLACE Take 1 capsule (100 mg total) by mouth 2 (two) times daily.   ibuprofen 100 MG/5ML suspension Commonly known as:  ADVIL,MOTRIN Take 40 mLs (800 mg total) by mouth every 8 (eight) hours.   norethindrone 0.35 MG tablet Commonly  known as:  MICRONOR,CAMILA,ERRIN Take 1 tablet (0.35 mg total) by mouth daily. Start 4-6 wks postpartum   prenatal multivitamin Tabs tablet Take 1 tablet by mouth daily at 12 noon.   PROAIR HFA 108 (90 Base) MCG/ACT inhaler Generic drug:  albuterol INHALE 2 PUFFS Q 4 - 6 H PRN   albuterol (2.5 MG/3ML) 0.083% nebulizer solution Commonly known as:  PROVENTIL Take 3 mLs (2.5 mg total) by nebulization every 4 (four) hours as needed for wheezing or shortness of breath.   sertraline 25 MG tablet Commonly known as:  ZOLOFT Take 1 tablet (25 mg total) by mouth daily.      Outpatient follow up:  In the office on Tuesday 06/19/17 for BP check Postpartum contraception: Progestin only pill, to start 4-6 wks postpartum  Discharged Condition: good  Discharged to: home  Newborn Data: Disposition:home with mother  Apgars: APGAR (1 MIN): 7   APGAR (5  MINS): 8   APGAR (10 MINS):    Baby Feeding: Bottle and Breast    Doreene Burke, CNM  06/17/2017 11:07 AM

## 2017-06-19 ENCOUNTER — Encounter: Payer: Medicaid Other | Admitting: Certified Nurse Midwife

## 2017-06-19 ENCOUNTER — Other Ambulatory Visit: Payer: Medicaid Other

## 2017-06-19 ENCOUNTER — Ambulatory Visit (INDEPENDENT_AMBULATORY_CARE_PROVIDER_SITE_OTHER): Payer: Medicaid Other | Admitting: Certified Nurse Midwife

## 2017-06-19 VITALS — BP 129/85 | HR 94

## 2017-06-19 DIAGNOSIS — Z0130 Encounter for examination of blood pressure without abnormal findings: Secondary | ICD-10-CM | POA: Diagnosis not present

## 2017-06-19 NOTE — Patient Instructions (Signed)
Preeclampsia and Eclampsia °Preeclampsia is a serious condition that develops only during pregnancy. It is also called toxemia of pregnancy. This condition causes high blood pressure along with other symptoms, such as swelling and headaches. These symptoms may develop as the condition gets worse. Preeclampsia may occur at 20 weeks of pregnancy or later. °Diagnosing and treating preeclampsia early is very important. If not treated early, it can cause serious problems for you and your baby. One problem it can lead to is eclampsia, which is a condition that causes muscle jerking or shaking (convulsions or seizures) in the mother. Delivering your baby is the best treatment for preeclampsia or eclampsia. Preeclampsia and eclampsia symptoms usually go away after your baby is born. °What are the causes? °The cause of preeclampsia is not known. °What increases the risk? °The following risk factors make you more likely to develop preeclampsia: °· Being pregnant for the first time. °· Having had preeclampsia during a past pregnancy. °· Having a family history of preeclampsia. °· Having high blood pressure. °· Being pregnant with twins or triplets. °· Being 35 or older. °· Being African-American. °· Having kidney disease or diabetes. °· Having medical conditions such as lupus or blood diseases. °· Being very overweight (obese). ° °What are the signs or symptoms? °The earliest signs of preeclampsia are: °· High blood pressure. °· Increased protein in your urine. Your health care provider will check for this at every visit before you give birth (prenatal visit). ° °Other symptoms that may develop as the condition gets worse include: °· Severe headaches. °· Sudden weight gain. °· Swelling of the hands, face, legs, and feet. °· Nausea and vomiting. °· Vision problems, such as blurred or double vision. °· Numbness in the face, arms, legs, and feet. °· Urinating less than usual. °· Dizziness. °· Slurred speech. °· Abdominal pain,  especially upper abdominal pain. °· Convulsions or seizures. ° °Symptoms generally go away after giving birth. °How is this diagnosed? °There are no screening tests for preeclampsia. Your health care provider will ask you about symptoms and check for signs of preeclampsia during your prenatal visits. You may also have tests that include: °· Urine tests. °· Blood tests. °· Checking your blood pressure. °· Monitoring your baby’s heart rate. °· Ultrasound. ° °How is this treated? °You and your health care provider will determine the treatment approach that is best for you. Treatment may include: °· Having more frequent prenatal exams to check for signs of preeclampsia, if you have an increased risk for preeclampsia. °· Bed rest. °· Reducing how much salt (sodium) you eat. °· Medicine to lower your blood pressure. °· Staying in the hospital, if your condition is severe. There, treatment will focus on controlling your blood pressure and the amount of fluids in your body (fluid retention). °· You may need to take medicine (magnesium sulfate) to prevent seizures. This medicine may be given as an injection or through an IV tube. °· Delivering your baby early, if your condition gets worse. You may have your labor started with medicine (induced), or you may have a cesarean delivery. ° °Follow these instructions at home: °Eating and drinking ° °· Drink enough fluid to keep your urine clear or pale yellow. °· Eat a healthy diet that is low in sodium. Do not add salt to your food. Check nutrition labels to see how much sodium a food or beverage contains. °· Avoid caffeine. °Lifestyle °· Do not use any products that contain nicotine or tobacco, such as cigarettes   and e-cigarettes. If you need help quitting, ask your health care provider. °· Do not use alcohol or drugs. °· Avoid stress as much as possible. Rest and get plenty of sleep. °General instructions °· Take over-the-counter and prescription medicines only as told by your  health care provider. °· When lying down, lie on your side. This keeps pressure off of your baby. °· When sitting or lying down, raise (elevate) your feet. Try putting some pillows underneath your lower legs. °· Exercise regularly. Ask your health care provider what kinds of exercise are best for you. °· Keep all follow-up and prenatal visits as told by your health care provider. This is important. °How is this prevented? °To prevent preeclampsia or eclampsia from developing during another pregnancy: °· Get proper medical care during pregnancy. Your health care provider may be able to prevent preeclampsia or diagnose and treat it early. °· Your health care provider may have you take a low-dose aspirin or a calcium supplement during your next pregnancy. °· You may have tests of your blood pressure and kidney function after giving birth. °· Maintain a healthy weight. Ask your health care provider for help managing weight gain during pregnancy. °· Work with your health care provider to manage any long-term (chronic) health conditions you have, such as diabetes or kidney problems. ° °Contact a health care provider if: °· You gain more weight than expected. °· You have headaches. °· You have nausea or vomiting. °· You have abdominal pain. °· You feel dizzy or light-headed. °Get help right away if: °· You develop sudden or severe swelling anywhere in your body. This usually happens in the legs. °· You gain 5 lbs (2.3 kg) or more during one week. °· You have severe: °? Abdominal pain. °? Headaches. °? Dizziness. °? Vision problems. °? Confusion. °? Nausea or vomiting. °· You have a seizure. °· You have trouble moving any part of your body. °· You develop numbness in any part of your body. °· You have trouble speaking. °· You have any abnormal bleeding. °· You pass out. °This information is not intended to replace advice given to you by your health care provider. Make sure you discuss any questions you have with your health  care provider. °Document Released: 01/07/2000 Document Revised: 09/07/2015 Document Reviewed: 08/16/2015 °Elsevier Interactive Patient Education © 2018 [employer] Inc. ° °

## 2017-06-19 NOTE — Progress Notes (Signed)
BP check after delivery. She denies any signs and symptoms of pre -e.  Follow up in 6 wk.  Doreene Burke, CNM

## 2017-06-19 NOTE — Progress Notes (Signed)
Pt is here for a BP check

## 2017-07-13 ENCOUNTER — Encounter: Payer: Self-pay | Admitting: Certified Nurse Midwife

## 2017-07-13 ENCOUNTER — Other Ambulatory Visit: Payer: Self-pay | Admitting: Certified Nurse Midwife

## 2017-07-13 MED ORDER — LEVONORGEST-ETH ESTRAD 91-DAY 0.15-0.03 &0.01 MG PO TABS: 1.0000 | ORAL_TABLET | Freq: Every day | ORAL | 4 refills | Status: DC

## 2017-07-13 NOTE — Progress Notes (Signed)
Pt done breast feeding request to change BC pill. Order placed.   Doreene BurkeAnnie Kysa Calais, CNM

## 2017-07-16 ENCOUNTER — Encounter: Payer: Self-pay | Admitting: Certified Nurse Midwife

## 2017-07-24 ENCOUNTER — Encounter: Payer: Self-pay | Admitting: Certified Nurse Midwife

## 2017-07-25 ENCOUNTER — Encounter: Payer: Self-pay | Admitting: Certified Nurse Midwife

## 2017-07-30 ENCOUNTER — Encounter: Payer: Medicaid Other | Admitting: Certified Nurse Midwife

## 2017-08-04 ENCOUNTER — Emergency Department
Admission: EM | Admit: 2017-08-04 | Discharge: 2017-08-04 | Disposition: A | Discharge status: Home / Self Care | Payer: Medicaid Other | Attending: Emergency Medicine | Admitting: Emergency Medicine

## 2017-08-04 ENCOUNTER — Encounter: Payer: Self-pay | Admitting: Emergency Medicine

## 2017-08-04 ENCOUNTER — Emergency Department: Payer: Medicaid Other

## 2017-08-04 ENCOUNTER — Other Ambulatory Visit: Payer: Self-pay

## 2017-08-04 DIAGNOSIS — M542 Cervicalgia: Secondary | ICD-10-CM | POA: Insufficient documentation

## 2017-08-04 DIAGNOSIS — Z87891 Personal history of nicotine dependence: Secondary | ICD-10-CM | POA: Diagnosis not present

## 2017-08-04 DIAGNOSIS — Z79899 Other long term (current) drug therapy: Secondary | ICD-10-CM | POA: Insufficient documentation

## 2017-08-04 DIAGNOSIS — J45909 Unspecified asthma, uncomplicated: Secondary | ICD-10-CM | POA: Diagnosis not present

## 2017-08-04 MED ORDER — CYCLOBENZAPRINE HCL 5 MG PO TABS
5.0000 mg | ORAL_TABLET | Freq: Three times a day (TID) | ORAL | 0 refills | Status: AC | PRN
Start: 2017-08-04 — End: 2017-08-07

## 2017-08-04 MED ORDER — MELOXICAM 15 MG PO TABS: 15.0000 mg | ORAL_TABLET | Freq: Every day | ORAL | 1 refills | Status: AC
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
## 2017-08-07 ENCOUNTER — Encounter: Payer: Self-pay | Admitting: Certified Nurse Midwife

## 2017-08-08 ENCOUNTER — Encounter: Payer: Medicaid Other | Admitting: Certified Nurse Midwife

## 2017-08-08 ENCOUNTER — Ambulatory Visit (INDEPENDENT_AMBULATORY_CARE_PROVIDER_SITE_OTHER): Payer: Medicaid Other | Admitting: Certified Nurse Midwife

## 2017-08-08 MED ORDER — METRONIDAZOLE 0.75 % VA GEL: 1.0000 | Freq: Every day | VAGINAL | 0 refills | Status: AC

## 2017-08-08 NOTE — Progress Notes (Signed)
Subjective:    Shelby Kelly is a 22 y.o. 711P1001 Caucasian female who presents for a postpartum visit. She is 8 weeks postpartum following a spontaneous vaginal delivery at 40.2 gestational weeks. Anesthesia: epidural. I have fully reviewed the prenatal and intrapartum course. Postpartum course has been WNL. Baby's course has been WNL Baby is feeding by bottle. Bleeding no bleeding. Bowel function is normal. Bladder function is normal. Patient is sexually active. Contraception method is OCP (estrogen/progesterone). Postpartum depression screening: negative. Score 9.  Last pap n/a.   The following portions of the patient's history were reviewed and updated as appropriate: allergies, current medications, past medical history, past surgical history and problem list.  Review of Systems Pertinent items are noted in HPI.   Vitals:   08/08/17 1103  BP: (!) 99/59  Pulse: 84  Weight: 107 lb 3 oz (48.6 kg)  Height: 4\' 11"  (1.499 m)   Patient's last menstrual period was 07/23/2017 (approximate).  Objective:   General:  alert, cooperative and no distress   Breasts:  deferred, no complaints  Lungs: clear to auscultation bilaterally  Heart:  regular rate and rhythm  Abdomen: soft, nontender   Vulva: normal  Vagina: normal vagina  Cervix:  closed  Corpus: Well-involuted  Adnexa:  Non-palpable  Rectal Exam: no hemorrhoids        Assessment:   Postpartum exam 8 wks s/p NSVD Bottle feeding Depression screening -negative Contraception counseling   Plan:  : OCP (estrogen/progesterone). Pt has odor and history of recurring BV. She notice that it happens frequently after intercourse or her period. Recommend Boric acid suppositories. Prescription today for Metrogel .  Follow up in: 6 weeks for annual exam or earlier if needed  Doreene BurkeAnnie Aadvika Konen, CNM

## 2017-08-08 NOTE — Patient Instructions (Signed)
Boric Acid vaginal suppository  What is this medicine?  BORIC ACID (BOHR ik AS id) helps to promote the proper acid balance in the vagina. It is used to help treat yeast infections of the vagina and relieve symptoms such as itching and burning.  This medicine may be used for other purposes; ask your health care provider or pharmacist if you have questions.  COMMON BRAND NAME(S): Hylafem  What should I tell my health care provider before I take this medicine?  They need to know if you have any of these conditions:  -diabetes  -frequent infections  -HIV or AIDS  -immune system problems  -an unusual or allergic reaction to boric acid, other medicines, foods, dyes, or preservatives  -pregnant or trying to get pregnant  -breast-feeding  How should I use this medicine?  This medicine is for use in the vagina. Do not take by mouth. Follow the directions on the prescription label. Read package directions carefully before using. Wash hands before and after use. Use this medicine at bedtime, unless otherwise directed by your doctor. Do not use your medicine more often than directed. Do not stop using this medicine except on your doctor's advice.  Talk to your pediatrician regarding the use of this medicine in children. This medicine is not approved for use in children.  Overdosage: If you think you have taken too much of this medicine contact a poison control center or emergency room at once.  NOTE: This medicine is only for you. Do not share this medicine with others.  What if I miss a dose?  If you miss a dose, use it as soon as you can. If it is almost time for your next dose, use only that dose. Do not use double or extra doses.  What may interact with this medicine?  Interactions are not expected. Do not use any other vaginal products without telling your doctor or health care professional.  This list may not describe all possible interactions. Give your health care provider a list of all the medicines, herbs,  non-prescription drugs, or dietary supplements you use. Also tell them if you smoke, drink alcohol, or use illegal drugs. Some items may interact with your medicine.  What should I watch for while using this medicine?  Tell your doctor or health care professional if your symptoms do not start to get better within a few days.  It is better not to have sex until you have finished your treatment. This medicine may damage condoms or diaphragms and cause them not to work properly. It may also decrease the effect of vaginal spermicides. Do not rely on any of these methods to prevent sexually transmitted diseases or pregnancy while you are using this medicine.  Vaginal medicines usually will come out of the vagina during treatment. To keep the medicine from getting on your clothing, wear a panty liner. The use of tampons is not recommended. To help clear up the infection, wear freshly washed cotton, not synthetic, underwear.  What side effects may I notice from receiving this medicine?  Side effects that you should report to your doctor or health care professional as soon as possible:  -allergic reactions like skin rash, itching or hives  -vaginal irritation, redness, or burning  Side effects that usually do not require medical attention (report to your doctor or health care professional if they continue or are bothersome):  -vaginal discharge  This list may not describe all possible side effects. Call your doctor for medical advice   30 degrees C (59 and 86 degrees F). Keep away from sunlight. Throw away any unused medicine after the expiration date. NOTE: This sheet is a summary. It may not cover all possible information. If you have questions about this medicine, talk to your doctor, pharmacist, or health care provider.  2018  Elsevier/Gold Standard (2015-02-11 07:29:58) Bacterial Vaginosis Bacterial vaginosis is an infection of the vagina. It happens when too many germs (bacteria) grow in the vagina. This infection puts you at risk for infections from sex (STIs). Treating this infection can lower your risk for some STIs. You should also treat this if you are pregnant. It can cause your baby to be born early. Follow these instructions at home: Medicines  Take over-the-counter and prescription medicines only as told by your doctor.  Take or use your antibiotic medicine as told by your doctor. Do not stop taking or using it even if you start to feel better. General instructions  If you your sexual partner is a woman, tell her that you have this infection. She needs to get treatment if she has symptoms. If you have a female partner, he does not need to be treated.  During treatment: ? Avoid sex. ? Do not douche. ? Avoid alcohol as told. ? Avoid breastfeeding as told.  Drink enough fluid to keep your pee (urine) clear or pale yellow.  Keep your vagina and butt (rectum) clean. ? Wash the area with warm water every day. ? Wipe from front to back after you use the toilet.  Keep all follow-up visits as told by your doctor. This is important. Preventing this condition  Do not douche.  Use only warm water to wash around your vagina.  Use protection when you have sex. This includes: ? Latex condoms. ? Dental dams.  Limit how many people you have sex with. It is best to only have sex with the same person (be monogamous).  Get tested for STIs. Have your partner get tested.  Wear underwear that is cotton or lined with cotton.  Avoid tight pants and pantyhose. This is most important in summer.  Do not use any products that have nicotine or tobacco in them. These include cigarettes and e-cigarettes. If you need help quitting, ask your doctor.  Do not use illegal drugs.  Limit how much alcohol you  drink. Contact a doctor if:  Your symptoms do not get better, even after you are treated.  You have more discharge or pain when you pee (urinate).  You have a fever.  You have pain in your belly (abdomen).  You have pain with sex.  Your bleed from your vagina between periods. Summary  This infection happens when too many germs (bacteria) grow in the vagina.  Treating this condition can lower your risk for some infections from sex (STIs).  You should also treat this if you are pregnant. It can cause early (premature) birth.  Do not stop taking or using your antibiotic medicine even if you start to feel better. This information is not intended to replace advice given to you by your health care provider. Make sure you discuss any questions you have with your health care provider. Document Released: 10/19/2007 Document Revised: 09/25/2015 Document Reviewed: 09/25/2015 Elsevier Interactive Patient Education  2017 [employer]

## 2017-08-08 NOTE — Progress Notes (Signed)
Pt is here for a post partum visit. Bottle feeding. Is taking OCPs. Has resumed intercourse. Has had a period. Screening 9. Still has symptoms of BV.

## 2017-10-08 ENCOUNTER — Encounter: Payer: Medicaid Other | Admitting: Certified Nurse Midwife

## 2017-10-08 ENCOUNTER — Telehealth: Payer: Self-pay | Admitting: Certified Nurse Midwife

## 2017-10-08 ENCOUNTER — Other Ambulatory Visit: Payer: Self-pay | Admitting: Certified Nurse Midwife

## 2017-10-08 MED ORDER — NORETHIN ACE-ETH ESTRAD-FE 1-20 MG-MCG PO TABS: 1.0000 | ORAL_TABLET | Freq: Every day | ORAL | 11 refills | Status: DC

## 2017-10-08 NOTE — Telephone Encounter (Signed)
Patient called and cancelled her appointment. She got called in to work. She would like to know if birth control pills could just be called in for her or if she needs an appointment. Thanks

## 2017-10-08 NOTE — Telephone Encounter (Signed)
I changed it to Junel. Please let her know that it was sent to her pharmacy.  Thanks,  Pattricia BossAnnie

## 2017-10-08 NOTE — Progress Notes (Signed)
Birth control changed per pt request.   Doreene BurkeAnnie Edeline Greening, CNM

## 2017-11-07 ENCOUNTER — Other Ambulatory Visit: Payer: Self-pay | Admitting: Certified Nurse Midwife

## 2017-11-07 MED ORDER — NORGESTIMATE-ETH ESTRADIOL 0.25-35 MG-MCG PO TABS: 1.0000 | ORAL_TABLET | Freq: Every day | ORAL | 11 refills | Status: DC

## 2017-11-07 NOTE — Progress Notes (Signed)
Pt request pill change due to heavy bleeding. Order placed.   Doreene Burke, CNM

## 2017-11-12 ENCOUNTER — Other Ambulatory Visit: Payer: Self-pay | Admitting: Certified Nurse Midwife

## 2017-11-12 MED ORDER — TRANEXAMIC ACID 650 MG PO TABS: 1300.0000 mg | ORAL_TABLET | Freq: Three times a day (TID) | ORAL | 0 refills | Status: AC

## 2017-11-12 NOTE — Progress Notes (Signed)
Pt having heavy bleeding on birth control. She is requesting medication to help stop bleeding. Orders placed.   Doreene Burke, CNM

## 2018-01-11 ENCOUNTER — Ambulatory Visit (INDEPENDENT_AMBULATORY_CARE_PROVIDER_SITE_OTHER): Payer: Medicaid Other | Admitting: Certified Nurse Midwife

## 2018-01-11 VITALS — BP 99/66 | HR 77 | Ht 59.0 in | Wt 100.5 lb

## 2018-01-11 DIAGNOSIS — R109 Unspecified abdominal pain: Secondary | ICD-10-CM

## 2018-01-11 DIAGNOSIS — Z3201 Encounter for pregnancy test, result positive: Secondary | ICD-10-CM | POA: Diagnosis not present

## 2018-01-11 NOTE — Progress Notes (Signed)
Subjective:    August Albinolyssa N Keckler is a 22 y.o. female who presents for evaluation of stomach pain, weight loss , and irregular periods. She states she has not had a period since she stopped taking her BC. She stopped because the bleeding was heavy.  Patient is ambivalent about pregnancy. Was not planned and is in shock.  Sexual Activity: single partner, contraception: none. Current symptoms also include: nausea. Last period was 2 months ago.   No LMP recorded (lmp unknown). The following portions of the patient's history were reviewed and updated as appropriate: allergies, current medications, past family history, past medical history, past social history, past surgical history and problem list.  Review of Systems Pertinent items are noted in HPI.     Objective:    BP 99/66   Pulse 77   Ht 4\' 11"  (1.499 m)   Wt 100 lb 8 oz (45.6 kg)   LMP  (LMP Unknown)   BMI 20.30 kg/m  General: alert, cooperative, appears stated age and no acute distress    Lab Review Urine HCG: positive    Assessment:    Absence of menstruation.     Plan:    Positive: EDC: unknown. Beta HCG today ultrasound ordered. Briefly discussed pre-natal care options. Pregnancy, Childbirth and the Newborn book given. Encouraged well-balanced diet, plenty of rest when needed, pre-natal vitamins daily and walking for exercise. Discussed self-help for nausea, avoiding OTC medications until consulting provider or pharmacist, other than Tylenol as needed, minimal caffeine (1-2 cups daily) and avoiding alcohol. She will schedule her initial OB visit in the next month with her PCP or OB provider. Feel free to call with any question  Doreene BurkeAnnie Ojas Coone, CNM

## 2018-01-14 ENCOUNTER — Other Ambulatory Visit: Payer: Medicaid Other

## 2018-01-14 ENCOUNTER — Ambulatory Visit (INDEPENDENT_AMBULATORY_CARE_PROVIDER_SITE_OTHER): Payer: Medicaid Other

## 2018-01-14 ENCOUNTER — Ambulatory Visit: Payer: Medicaid Other

## 2018-01-14 DIAGNOSIS — Z3A01 Less than 8 weeks gestation of pregnancy: Secondary | ICD-10-CM

## 2018-01-14 DIAGNOSIS — O3481 Maternal care for other abnormalities of pelvic organs, first trimester: Secondary | ICD-10-CM | POA: Diagnosis not present

## 2018-01-14 DIAGNOSIS — N8311 Corpus luteum cyst of right ovary: Secondary | ICD-10-CM

## 2018-01-14 DIAGNOSIS — Z3201 Encounter for pregnancy test, result positive: Secondary | ICD-10-CM

## 2018-01-15 LAB — LIPASE: Lipase: 18 U/L (ref 14–72)

## 2018-01-15 LAB — COMPREHENSIVE METABOLIC PANEL
ALT: 23 IU/L (ref 0–32)
AST: 14 IU/L (ref 0–40)
Albumin/Globulin Ratio: 1.6 (ref 1.2–2.2)
Albumin: 4.6 g/dL (ref 3.5–5.5)
Alkaline Phosphatase: 68 IU/L (ref 39–117)
BUN / CREAT RATIO: 9 (ref 9–23)
BUN: 5 mg/dL — ABNORMAL LOW (ref 6–20)
Bilirubin Total: 0.6 mg/dL (ref 0.0–1.2)
CALCIUM: 10.3 mg/dL — AB (ref 8.7–10.2)
CO2: 19 mmol/L — AB (ref 20–29)
CREATININE: 0.57 mg/dL (ref 0.57–1.00)
Chloride: 99 mmol/L (ref 96–106)
GFR calc Af Amer: 154 mL/min/{1.73_m2} (ref >59)
GFR, EST NON AFRICAN AMERICAN: 134 mL/min/{1.73_m2} (ref >59)
GLOBULIN, TOTAL: 2.9 g/dL (ref 1.5–4.5)
Glucose: 74 mg/dL (ref 65–99)
Potassium: 4.4 mmol/L (ref 3.5–5.2)
SODIUM: 137 mmol/L (ref 134–144)
Total Protein: 7.5 g/dL (ref 6.0–8.5)

## 2018-01-15 LAB — AMYLASE: Amylase: 49 U/L (ref 31–124)

## 2018-01-15 LAB — CBC
HEMATOCRIT: 42.7 % (ref 34.0–46.6)
Hemoglobin: 14.2 g/dL (ref 11.1–15.9)
MCH: 27.8 pg (ref 26.6–33.0)
MCHC: 33.3 g/dL (ref 31.5–35.7)
MCV: 84 fL (ref 79–97)
Platelets: 273 10*3/uL (ref 150–450)
RBC: 5.1 x10E6/uL (ref 3.77–5.28)
RDW: 12 % — AB (ref 12.3–15.4)
WBC: 13.7 10*3/uL — AB (ref 3.4–10.8)

## 2018-01-15 LAB — BETA HCG QUANT (REF LAB): hCG Quant: 174923 m[IU]/mL

## 2018-01-23 NOTE — L&D Delivery Note (Signed)
Delivery Note At 0035  a viable and healthy female was delivered via  (Presentation:IOA ;  ).  APGAR: ,9 ; weight pending  .   Placenta status: delivered intact with calcifications and 3 vessel Cord:  with the following complications: none  Anesthesia:  epidural Episiotomy:  none Lacerations:  none Suture Repair: NA Est. Blood Loss (mL):  200  Mom to postpartum.  Baby to Couplet care / Skin to Skin.  Abner Ardis N Amrit Erck 08/26/2018, 12:46 AM

## 2018-01-31 ENCOUNTER — Ambulatory Visit (INDEPENDENT_AMBULATORY_CARE_PROVIDER_SITE_OTHER): Payer: Medicaid Other | Admitting: Certified Nurse Midwife

## 2018-01-31 ENCOUNTER — Other Ambulatory Visit (INDEPENDENT_AMBULATORY_CARE_PROVIDER_SITE_OTHER): Payer: Medicaid Other

## 2018-01-31 ENCOUNTER — Other Ambulatory Visit (HOSPITAL_COMMUNITY)
Admission: RE | Admit: 2018-01-31 | Discharge: 2018-01-31 | Disposition: A | Discharge status: Home / Self Care | Payer: Medicaid Other | Source: Ambulatory Visit | Attending: Certified Nurse Midwife | Admitting: Certified Nurse Midwife

## 2018-01-31 DIAGNOSIS — O208 Other hemorrhage in early pregnancy: Secondary | ICD-10-CM

## 2018-01-31 DIAGNOSIS — Z3A09 9 weeks gestation of pregnancy: Secondary | ICD-10-CM | POA: Diagnosis not present

## 2018-01-31 DIAGNOSIS — O209 Hemorrhage in early pregnancy, unspecified: Secondary | ICD-10-CM

## 2018-01-31 NOTE — Patient Instructions (Signed)
WHAT OB PATIENTS CAN EXPECT   Confirmation of pregnancy and ultrasound ordered if medically indicated-[redacted] weeks gestation  New OB (NOB) intake with nurse and New OB (NOB) labs- [redacted] weeks gestation  New OB (NOB) physical examination with provider- 11/[redacted] weeks gestation  Flu vaccine-[redacted] weeks gestation  Anatomy scan-[redacted] weeks gestation  Glucose tolerance test, blood work to test for anemia, T-dap vaccine-[redacted] weeks gestation  Vaginal swabs/cultures-STD/Group B strep-[redacted] weeks gestation  Appointments every 4 weeks until 28 weeks  Every 2 weeks from 28 weeks until 36 weeks  Weekly visits from 36 weeks until delivery  Vaginal Bleeding During Pregnancy, First Trimester  A small amount of bleeding (spotting) from the vagina is common during early pregnancy. Sometimes the bleeding is normal and does not cause problems. At other times, though, bleeding may be a sign of something serious. Tell your doctor about any bleeding from your vagina right away. Follow these instructions at home: Activity  Follow your doctor's instructions about how active you can be.  If needed, make plans for someone to help with your normal activities.  Do not have sex or orgasms until your doctor says that this is safe. General instructions  Take over-the-counter and prescription medicines only as told by your doctor.  Watch your condition for any changes.  Write down: ? The number of pads you use each day. ? How often you change pads. ? How soaked (saturated) your pads are.  Do not use tampons.  Do not douche.  If you pass any tissue from your vagina, save it to show to your doctor.  Keep all follow-up visits as told by your doctor. This is important. Contact a doctor if:  You have vaginal bleeding at any time while you are pregnant.  You have cramps.  You have a fever. Get help right away if:  You have very bad cramps in your back or belly (abdomen).  You pass large clots or a lot of tissue  from your vagina.  Your bleeding gets worse.  You feel light-headed.  You feel weak.  You pass out (faint).  You have chills.  You are leaking fluid from your vagina.  You have a gush of fluid from your vagina. Summary  Sometimes vaginal bleeding during pregnancy is normal and does not cause problems. At other times, bleeding may be a sign of something serious.  Tell your doctor about any bleeding from your vagina right away.  Follow your doctor's instructions about how active you can be. You may need someone to help you with your normal activities. This information is not intended to replace advice given to you by your health care provider. Make sure you discuss any questions you have with your health care provider. Document Released: 05/26/2013 Document Revised: 04/12/2016 Document Reviewed: 04/12/2016 Elsevier Interactive Patient Education  2019 [employer].

## 2018-01-31 NOTE — Progress Notes (Signed)
Patient c/o dark brown VB and intermittent abdominal cramping that started over a week ago, feels gushes coming out.  No recent IC, last time was 2 weeks ago.
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
## 2018-02-04 LAB — CERVICOVAGINAL ANCILLARY ONLY
BACTERIAL VAGINITIS: NEGATIVE
CANDIDA VAGINITIS: NEGATIVE
CHLAMYDIA, DNA PROBE: NEGATIVE
Neisseria Gonorrhea: NEGATIVE
TRICH (WINDOWPATH): NEGATIVE

## 2018-02-05 NOTE — Progress Notes (Signed)
Shelby Kelly presents for NOB nurse interview visit. Pregnancy confirmation done [facility] 01/11/09 AT.  G-2 .  P-1 0 0 1   . Pregnancy education material explained and given. 0 cats in the home. NOB labs ordered. HIV labs and Drug screen were explained optional and she did not decline. Drug screen ordered. PNV encouraged. Genetic screening options discussed. Genetic testing: Ordered.  Pt may discuss with provider. Pt. To follow up with AT 02/13/18 for NOB physical.  All questions answered. Dating and viability done 01/31/18. Wishes to follow with midwives.

## 2018-02-07 ENCOUNTER — Ambulatory Visit: Payer: Medicaid Other | Admitting: Certified Nurse Midwife

## 2018-02-07 ENCOUNTER — Encounter: Payer: Self-pay | Admitting: Certified Nurse Midwife

## 2018-02-07 VITALS — BP 95/58 | HR 75 | Ht 59.0 in | Wt 100.4 lb

## 2018-02-07 DIAGNOSIS — Z3482 Encounter for supervision of other normal pregnancy, second trimester: Secondary | ICD-10-CM

## 2018-02-07 LAB — OB RESULTS CONSOLE VARICELLA ZOSTER ANTIBODY, IGG: Varicella: NON-IMMUNE/NOT IMMUNE

## 2018-02-08 ENCOUNTER — Encounter: Payer: Medicaid Other | Admitting: Certified Nurse Midwife

## 2018-02-08 LAB — URINALYSIS, ROUTINE W REFLEX MICROSCOPIC
BILIRUBIN UA: NEGATIVE
Glucose, UA: NEGATIVE
Ketones, UA: NEGATIVE
LEUKOCYTES UA: NEGATIVE
Nitrite, UA: NEGATIVE
PH UA: 7 (ref 5.0–7.5)
Protein, UA: NEGATIVE
RBC, UA: NEGATIVE
SPEC GRAV UA: 1.014 (ref 1.005–1.030)
Urobilinogen, Ur: 0.2 mg/dL (ref 0.2–1.0)

## 2018-02-08 LAB — CBC WITH DIFFERENTIAL
BASOS ABS: 0.1 10*3/uL (ref 0.0–0.2)
Basos: 1 %
EOS (ABSOLUTE): 0.1 10*3/uL (ref 0.0–0.4)
Eos: 1 %
HEMOGLOBIN: 13.3 g/dL (ref 11.1–15.9)
Hematocrit: 38.1 % (ref 34.0–46.6)
IMMATURE GRANULOCYTES: 0 %
Immature Grans (Abs): 0 10*3/uL (ref 0.0–0.1)
LYMPHS ABS: 1.5 10*3/uL (ref 0.7–3.1)
Lymphs: 16 %
MCH: 28.5 pg (ref 26.6–33.0)
MCHC: 34.9 g/dL (ref 31.5–35.7)
MCV: 82 fL (ref 79–97)
MONOCYTES: 4 %
MONOS ABS: 0.4 10*3/uL (ref 0.1–0.9)
NEUTROS ABS: 7.4 10*3/uL — AB (ref 1.4–7.0)
Neutrophils: 78 %
RBC: 4.66 x10E6/uL (ref 3.77–5.28)
RDW: 12.7 % (ref 11.7–15.4)
WBC: 9.5 10*3/uL (ref 3.4–10.8)

## 2018-02-08 LAB — HIV ANTIBODY (ROUTINE TESTING W REFLEX): HIV Screen 4th Generation wRfx: NONREACTIVE

## 2018-02-08 LAB — ABO AND RH: RH TYPE: POSITIVE

## 2018-02-08 LAB — HEPATITIS B SURFACE ANTIGEN: HEP B S AG: NEGATIVE

## 2018-02-08 LAB — RUBELLA SCREEN: Rubella Antibodies, IGG: 1.46 index (ref >0.99)

## 2018-02-08 LAB — ANTIBODY SCREEN: ANTIBODY SCREEN: NEGATIVE

## 2018-02-08 LAB — VARICELLA ZOSTER ANTIBODY, IGG: Varicella zoster IgG: <135 index — ABNORMAL LOW (ref >165)

## 2018-02-08 LAB — RPR: RPR Ser Ql: NONREACTIVE

## 2018-02-11 LAB — URINE CULTURE

## 2018-02-12 LAB — MONITOR DRUG PROFILE 14(MW)
Amphetamine Scrn, Ur: NEGATIVE ng/mL
BARBITURATE SCREEN URINE: NEGATIVE ng/mL
BENZODIAZEPINE SCREEN, URINE: NEGATIVE ng/mL
BUPRENORPHINE, URINE: NEGATIVE ng/mL
Cocaine (Metab) Scrn, Ur: NEGATIVE ng/mL
Creatinine(Crt), U: 71.7 mg/dL (ref 20.0–300.0)
FENTANYL, URINE: NEGATIVE pg/mL
Meperidine Screen, Urine: NEGATIVE ng/mL
Methadone Screen, Urine: NEGATIVE ng/mL
OXYCODONE+OXYMORPHONE UR QL SCN: NEGATIVE ng/mL
Opiate Scrn, Ur: NEGATIVE ng/mL
PH UR, DRUG SCRN: 6.8 (ref 4.5–8.9)
PHENCYCLIDINE QUANTITATIVE URINE: NEGATIVE ng/mL
PROPOXYPHENE SCREEN URINE: NEGATIVE ng/mL
SPECIFIC GRAVITY: 1.016
Tramadol Screen, Urine: NEGATIVE ng/mL

## 2018-02-12 LAB — CANNABINOID (GC/MS), URINE
CARBOXY THC UR: 89 ng/mL
Cannabinoid: POSITIVE — AB

## 2018-02-13 ENCOUNTER — Ambulatory Visit (INDEPENDENT_AMBULATORY_CARE_PROVIDER_SITE_OTHER): Payer: Medicaid Other | Admitting: Certified Nurse Midwife

## 2018-02-13 DIAGNOSIS — Z5329 Procedure and treatment not carried out because of patient's decision for other reasons: Secondary | ICD-10-CM

## 2018-02-15 NOTE — Progress Notes (Signed)
Pt did not show for appointment. 

## 2018-02-22 ENCOUNTER — Ambulatory Visit (INDEPENDENT_AMBULATORY_CARE_PROVIDER_SITE_OTHER): Payer: 59 | Admitting: Certified Nurse Midwife

## 2018-02-22 ENCOUNTER — Other Ambulatory Visit (HOSPITAL_COMMUNITY)
Admission: RE | Admit: 2018-02-22 | Discharge: 2018-02-22 | Disposition: A | Discharge status: Home / Self Care | Payer: Medicaid Other | Source: Ambulatory Visit | Attending: Certified Nurse Midwife | Admitting: Certified Nurse Midwife

## 2018-02-22 ENCOUNTER — Encounter: Payer: Self-pay | Admitting: Certified Nurse Midwife

## 2018-02-22 VITALS — BP 109/73 | HR 109 | Wt 100.1 lb

## 2018-02-22 DIAGNOSIS — Z124 Encounter for screening for malignant neoplasm of cervix: Secondary | ICD-10-CM | POA: Diagnosis present

## 2018-02-22 DIAGNOSIS — Z3481 Encounter for supervision of other normal pregnancy, first trimester: Secondary | ICD-10-CM | POA: Diagnosis not present

## 2018-02-22 DIAGNOSIS — Z3A12 12 weeks gestation of pregnancy: Secondary | ICD-10-CM

## 2018-02-22 DIAGNOSIS — Z3490 Encounter for supervision of normal pregnancy, unspecified, unspecified trimester: Secondary | ICD-10-CM | POA: Insufficient documentation

## 2018-02-22 DIAGNOSIS — Z87898 Personal history of other specified conditions: Secondary | ICD-10-CM | POA: Insufficient documentation

## 2018-02-22 DIAGNOSIS — Z8709 Personal history of other diseases of the respiratory system: Secondary | ICD-10-CM

## 2018-02-22 NOTE — Patient Instructions (Signed)
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
## 2018-02-25 LAB — CYTOLOGY - PAP: Diagnosis: NEGATIVE

## 2018-03-22 ENCOUNTER — Ambulatory Visit (INDEPENDENT_AMBULATORY_CARE_PROVIDER_SITE_OTHER): Payer: 59 | Admitting: Obstetrics and Gynecology

## 2018-03-22 VITALS — BP 94/62 | HR 115 | Wt 100.9 lb

## 2018-03-22 DIAGNOSIS — Z3492 Encounter for supervision of normal pregnancy, unspecified, second trimester: Secondary | ICD-10-CM

## 2018-03-22 NOTE — Progress Notes (Signed)
ROB- pt is doing well,  

## 2018-03-22 NOTE — Progress Notes (Signed)
ROB- doing well, reviewed genetic test- all normal, anatomy scan next visit. Having headaches daily, and dizzy spells. Encouraged to stay hydrated.

## 2018-04-03 ENCOUNTER — Telehealth: Payer: Self-pay

## 2018-04-03 NOTE — Telephone Encounter (Signed)
Note written per [employer]

## 2018-04-17 ENCOUNTER — Other Ambulatory Visit (INDEPENDENT_AMBULATORY_CARE_PROVIDER_SITE_OTHER): Payer: Medicaid Other

## 2018-04-17 ENCOUNTER — Other Ambulatory Visit: Payer: Self-pay

## 2018-04-17 DIAGNOSIS — Z3492 Encounter for supervision of normal pregnancy, unspecified, second trimester: Secondary | ICD-10-CM | POA: Diagnosis not present

## 2018-04-18 ENCOUNTER — Encounter: Payer: Medicaid Other | Admitting: Obstetrics and Gynecology

## 2018-05-27 ENCOUNTER — Telehealth: Payer: Self-pay | Admitting: Obstetrics and Gynecology

## 2018-05-27 NOTE — Telephone Encounter (Signed)
The patient sent a MyChart message requesting an appointment for an ob check up. After reviewing the patients past apt desk the patient has not been seen since February by a provider. Pt w/ Nurse. Nurse will reach out to patient before scheduling. Thank you.

## 2018-06-03 ENCOUNTER — Telehealth: Payer: Self-pay

## 2018-06-03 NOTE — Telephone Encounter (Signed)
Coronavirus (COVID-19) Are you at risk?  Are you at risk for the Coronavirus (COVID-19)?  To be considered HIGH RISK for Coronavirus (COVID-19), you have to meet the following criteria:  . Traveled to [place], [place], [place], [place] or [place]; or in the [place] to [place], [place], [place], or [place]; and have fever, cough, and shortness of breath within the last 2 weeks of travel OR . Been in close contact with a person diagnosed with COVID-19 within the last 2 weeks and have fever, cough, and shortness of breath . IF YOU DO NOT MEET THESE CRITERIA, YOU ARE CONSIDERED LOW RISK FOR COVID-19.  What to do if you are HIGH RISK for COVID-19?  . If you are having a medical emergency, call 911. . Seek medical care right away. Before you go to a doctor's office, urgent care or emergency department, call ahead and tell them about your recent travel, contact with someone diagnosed with COVID-19, and your symptoms. You should receive instructions from your physician's office regarding next steps of care.  . When you arrive at healthcare provider, tell the healthcare staff immediately you have returned from visiting [place], [place], [place], [place] or [place]; or traveled in the [place] to [place], [place], [place], or [place]; in the last two weeks or you have been in close contact with a person diagnosed with COVID-19 in the last 2 weeks.   . Tell the health care staff about your symptoms: fever, cough and shortness of breath. . After you have been seen by a medical provider, you will be either: o Tested for (COVID-19) and discharged home on quarantine except to seek medical care if symptoms worsen, and asked to  - Stay home and avoid contact with others until you get your results (4-5 days)  - Avoid travel on public transportation if possible (such as bus, train, or airplane) or o Sent to the Emergency Department by EMS for evaluation, COVID-19 testing, and possible  admission depending on your condition and test results.  What to do if you are LOW RISK for COVID-19?  Reduce your risk of any infection by using the same precautions used for avoiding the common cold or flu:  . Wash your hands often with soap and warm water for at least 20 seconds.  If soap and water are not readily available, use an alcohol-based hand sanitizer with at least 60% alcohol.  . If coughing or sneezing, cover your mouth and nose by coughing or sneezing into the elbow areas of your shirt or coat, into a tissue or into your sleeve (not your hands). . Avoid shaking hands with others and consider head nods or verbal greetings only. . Avoid touching your eyes, nose, or mouth with unwashed hands.  . Avoid close contact with people who are sick. . Avoid places or events with large numbers of people in one location, like concerts or sporting events. . Carefully consider travel plans you have or are making. . If you are planning any travel outside or inside the [place], visit the CDC's Travelers' Health webpage for the latest health notices. . If you have some symptoms but not all symptoms, continue to monitor at home and seek medical attention if your symptoms worsen. . If you are having a medical emergency, call 911.   ADDITIONAL HEALTHCARE OPTIONS FOR PATIENTS  [facility] / e-Visit: [email or web address]         [facility]: [phone]  [facility]: [phone]                   [facility]: [phone]  Prescre[facility]. cm 

## 2018-06-04 ENCOUNTER — Other Ambulatory Visit: Payer: Self-pay

## 2018-06-04 ENCOUNTER — Encounter: Payer: Self-pay | Admitting: Certified Nurse Midwife

## 2018-06-04 ENCOUNTER — Ambulatory Visit (INDEPENDENT_AMBULATORY_CARE_PROVIDER_SITE_OTHER): Payer: Medicaid Other | Admitting: Certified Nurse Midwife

## 2018-06-04 ENCOUNTER — Ambulatory Visit (INDEPENDENT_AMBULATORY_CARE_PROVIDER_SITE_OTHER): Payer: Medicaid Other

## 2018-06-04 VITALS — BP 103/62 | HR 96 | Wt 110.0 lb

## 2018-06-04 DIAGNOSIS — Z3493 Encounter for supervision of normal pregnancy, unspecified, third trimester: Secondary | ICD-10-CM

## 2018-06-04 DIAGNOSIS — Z3492 Encounter for supervision of normal pregnancy, unspecified, second trimester: Secondary | ICD-10-CM

## 2018-06-04 DIAGNOSIS — O480 Post-term pregnancy: Secondary | ICD-10-CM | POA: Diagnosis not present

## 2018-06-04 DIAGNOSIS — Z8759 Personal history of other complications of pregnancy, childbirth and the puerperium: Secondary | ICD-10-CM | POA: Insufficient documentation

## 2018-06-04 DIAGNOSIS — O09899 Supervision of other high risk pregnancies, unspecified trimester: Secondary | ICD-10-CM | POA: Insufficient documentation

## 2018-06-04 LAB — POCT URINALYSIS DIPSTICK OB
Bilirubin, UA: NEGATIVE
Blood, UA: NEGATIVE
Glucose, UA: NEGATIVE
Ketones, UA: NEGATIVE
Leukocytes, UA: NEGATIVE
Nitrite, UA: NEGATIVE
Spec Grav, UA: 1.01 (ref 1.010–1.025)
Urobilinogen, UA: 0.2 E.U./dL
pH, UA: 7.5 (ref 5.0–8.0)

## 2018-06-04 MED ORDER — ASPIRIN EC 81 MG PO TBEC: 81.0000 mg | DELAYED_RELEASE_TABLET | Freq: Every day | ORAL | 2 refills | Status: DC

## 2018-06-04 NOTE — Patient Instructions (Addendum)
WHAT OB PATIENTS CAN EXPECT   Confirmation of pregnancy and ultrasound ordered if medically indicated-[redacted] weeks gestation  New OB (NOB) intake with nurse and New OB (NOB) labs- [redacted] weeks gestation  New OB (NOB) physical examination with provider- 11/[redacted] weeks gestation  Flu vaccine-[redacted] weeks gestation  Anatomy scan-[redacted] weeks gestation  Glucose tolerance test, blood work to test for anemia, T-dap vaccine-[redacted] weeks gestation  Vaginal swabs/cultures-STD/Group B strep-[redacted] weeks gestation  Appointments every 4 weeks until 28 weeks  Every 2 weeks from 28 weeks until 36 weeks  Weekly visits from 36 weeks until delivery  Round Ligament Pain  The round ligament is a cord of muscle and tissue that helps support the uterus. It can become a source of pain during pregnancy if it becomes stretched or twisted as the baby grows. The pain usually begins in the second trimester (13-28 weeks) of pregnancy, and it can come and go until the baby is delivered. It is not a serious problem, and it does not cause harm to the baby. Round ligament pain is usually a short, sharp, and pinching pain, but it can also be a dull, lingering, and aching pain. The pain is felt in the lower side of the abdomen or in the groin. It usually starts deep in the groin and moves up to the outside of the hip area. The pain may occur when you:  Suddenly change position, such as quickly going from a sitting to standing position.  Roll over in bed.  Cough or sneeze.  Do physical activity. Follow these instructions at home:   Watch your condition for any changes.  When the pain starts, relax. Then try any of these methods to help with the pain: ? Sitting down. ? Flexing your knees up to your abdomen. ? Lying on your side with one pillow under your abdomen and another pillow between your legs. ? Sitting in a warm bath for 15-20 minutes or until the pain goes away.  Take over-the-counter and prescription medicines only as told by  your health care provider.  Move slowly when you sit down or stand up.  Avoid long walks if they cause pain.  Stop or reduce your physical activities if they cause pain.  Keep all follow-up visits as told by your health care provider. This is important. Contact a health care provider if:  Your pain does not go away with treatment.  You feel pain in your back that you did not have before.  Your medicine is not helping. Get help right away if:  You have a fever or chills.  You develop uterine contractions.  You have vaginal bleeding.  You have nausea or vomiting.  You have diarrhea.  You have pain when you urinate. Summary  Round ligament pain is felt in the lower abdomen or groin. It is usually a short, sharp, and pinching pain. It can also be a dull, lingering, and aching pain.  This pain usually begins in the second trimester (13-28 weeks). It occurs because the uterus is stretching with the growing baby, and it is not harmful to the baby.  You may notice the pain when you suddenly change position, when you cough or sneeze, or during physical activity.  Relaxing, flexing your knees to your abdomen, lying on one side, or taking a warm bath may help to get rid of the pain.  Get help from your health care provider if the pain does not go away or if you have vaginal bleeding, nausea, vomiting,  diarrhea, or painful urination. This information is not intended to replace advice given to you by your health care provider. Make sure you discuss any questions you have with your health care provider. Document Released: 10/19/2007 Document Revised: 06/27/2017 Document Reviewed: 06/27/2017 Elsevier Interactive Patient Education  2019 [employer] Back Pain in Pregnancy Back pain during pregnancy is common. Back pain may be caused by several factors that are related to changes during your pregnancy. Follow these instructions at home: Managing pain, stiffness, and swelling       If directed, for sudden (acute) back pain, put ice on the painful area. ? Put ice in a plastic bag. ? Place a towel between your skin and the bag. ? Leave the ice on for 20 minutes, 2-3 times per day.  If directed, apply heat to the affected area before you exercise. Use the heat source that your health care provider recommends, such as a moist heat pack or a heating pad. ? Place a towel between your skin and the heat source. ? Leave the heat on for 20-30 minutes. ? Remove the heat if your skin turns bright red. This is especially important if you are unable to feel pain, heat, or cold. You may have a greater risk of getting burned.  If directed, massage the affected area. Activity  Exercise as told by your health care provider. Gentle exercise is the best way to prevent or manage back pain.  Listen to your body when lifting. If lifting hurts, ask for help or bend your knees. This uses your leg muscles instead of your back muscles.  Squat down when picking up something from the floor. Do not bend over.  Only use bed rest for short periods as told by your health care provider. Bed rest should only be used for the most severe episodes of back pain. Standing, sitting, and lying down  Do not stand in one place for long periods of time.  Use good posture when sitting. Make sure your head rests over your shoulders and is not hanging forward. Use a pillow on your lower back if necessary.  Try sleeping on your side, preferably the left side, with a pregnancy support pillow or 1-2 regular pillows between your legs. ? If you have back pain after a night's rest, your bed may be too soft. ? A firm mattress may provide more support for your back during pregnancy. General instructions  Do not wear high heels.  Eat a healthy diet. Try to gain weight within your health care provider's recommendations.  Use a maternity girdle, elastic sling, or back brace as told by your health care provider.   Take over-the-counter and prescription medicines only as told by your health care provider.  Work with a physical therapist or massage therapist to find ways to manage back pain. Acupuncture or massage therapy may be helpful.  Keep all follow-up visits as told by your health care provider. This is important. Contact a health care provider if:  Your back pain interferes with your daily activities.  You have increasing pain in other parts of your body. Get help right away if:  You develop numbness, tingling, weakness, or problems with the use of your arms or legs.  You develop severe back pain that is not controlled with medicine.  You have a change in bowel or bladder control.  You develop shortness of breath, dizziness, or you faint.  You develop nausea, vomiting, or sweating.  You have back pain that is a  rhythmic, cramping pain similar to labor pains. Labor pain is usually 1-2 minutes apart, lasts for about 1 minute, and involves a bearing down feeling or pressure in your pelvis.  You have back pain and your water breaks or you have vaginal bleeding.  You have back pain or numbness that travels down your leg.  Your back pain developed after you fell.  You develop pain on one side of your back.  You see blood in your urine.  You develop skin blisters in the area of your back pain. Summary  Back pain may be caused by several factors that are related to changes during your pregnancy.  Follow instructions as told by your health care provider for managing pain, stiffness, and swelling.  Exercise as told by your health care provider. Gentle exercise is the best way to prevent or manage back pain.  Take over-the-counter and prescription medicines only as told by your health care provider.  Keep all follow-up visits as told by your health care provider. This is important. This information is not intended to replace advice given to you by your health care provider. Make sure  you discuss any questions you have with your health care provider. Document Released: 04/19/2005 Document Revised: 06/27/2017 Document Reviewed: 06/27/2017 Elsevier Interactive Patient Education  2019 [employer] Fetal Movement Counts Patient Name: ________________________________________________ Patient Due Date: ____________________ What is a fetal movement count?  A fetal movement count is the number of times that you feel your baby move during a certain amount of time. This may also be called a fetal kick count. A fetal movement count is recommended for every pregnant woman. You may be asked to start counting fetal movements as early as week 28 of your pregnancy. Pay attention to when your baby is most active. You may notice your baby's sleep and wake cycles. You may also notice things that make your baby move more. You should do a fetal movement count:  When your baby is normally most active.  At the same time each day. A good time to count movements is while you are resting, after having something to eat and drink. How do I count fetal movements? 1. Find a quiet, comfortable area. Sit, or lie down on your side. 2. Write down the date, the start time and stop time, and the number of movements that you felt between those two times. Take this information with you to your health care visits. 3. For 2 hours, count kicks, flutters, swishes, rolls, and jabs. You should feel at least 10 movements during 2 hours. 4. You may stop counting after you have felt 10 movements. 5. If you do not feel 10 movements in 2 hours, have something to eat and drink. Then, keep resting and counting for 1 hour. If you feel at least 4 movements during that hour, you may stop counting. Contact a health care provider if:  You feel fewer than 4 movements in 2 hours.  Your baby is not moving like he or she usually does. Date: ____________ Start time: ____________ Stop time: ____________ Movements: ____________  Date: ____________ Start time: ____________ Stop time: ____________ Movements: ____________ Date: ____________ Start time: ____________ Stop time: ____________ Movements: ____________ Date: ____________ Start time: ____________ Stop time: ____________ Movements: ____________ Date: ____________ Start time: ____________ Stop time: ____________ Movements: ____________ Date: ____________ Start time: ____________ Stop time: ____________ Movements: ____________ Date: ____________ Start time: ____________ Stop time: ____________ Movements: ____________ Date: ____________ Start time: ____________ Stop time: ____________ Movements: ____________ Date:  ____________ Start time: ____________ Stop time: ____________ Movements: ____________ This information is not intended to replace advice given to you by your health care provider. Make sure you discuss any questions you have with your health care provider. Document Released: 02/08/2006 Document Revised: 09/08/2015 Document Reviewed: 02/18/2015 Elsevier Interactive Patient Education  2019 [employer]   Breastfeeding  Choosing to breastfeed is one of the best decisions you can make for yourself and your baby. A change in hormones during pregnancy causes your breasts to make breast milk in your milk-producing glands. Hormones prevent breast milk from being released before your baby is born. They also prompt milk flow after birth. Once breastfeeding has begun, thoughts of your baby, as well as his or her sucking or crying, can stimulate the release of milk from your milk-producing glands. Benefits of breastfeeding Research shows that breastfeeding offers many health benefits for infants and mothers. It also offers a cost-free and convenient way to feed your baby. For your baby  Your first milk (colostrum) helps your baby's digestive system to function better.  Special cells in your milk (antibodies) help your baby to fight off infections.  Breastfed  babies are less likely to develop asthma, allergies, obesity, or type 2 diabetes. They are also at lower risk for sudden infant death syndrome (SIDS).  Nutrients in breast milk are better able to meet your baby's needs compared to infant formula.  Breast milk improves your baby's brain development. For you  Breastfeeding helps to create a very special bond between you and your baby.  Breastfeeding is convenient. Breast milk costs nothing and is always available at the correct temperature.  Breastfeeding helps to burn calories. It helps you to lose the weight that you gained during pregnancy.  Breastfeeding makes your uterus return faster to its size before pregnancy. It also slows bleeding (lochia) after you give birth.  Breastfeeding helps to lower your risk of developing type 2 diabetes, osteoporosis, rheumatoid arthritis, cardiovascular disease, and breast, ovarian, uterine, and endometrial cancer later in life. Breastfeeding basics Starting breastfeeding  Find a comfortable place to sit or lie down, with your neck and back well-supported.  Place a pillow or a rolled-up blanket under your baby to bring him or her to the level of your breast (if you are seated). Nursing pillows are specially designed to help support your arms and your baby while you breastfeed.  Make sure that your baby's tummy (abdomen) is facing your abdomen.  Gently massage your breast. With your fingertips, massage from the outer edges of your breast inward toward the nipple. This encourages milk flow. If your milk flows slowly, you may need to continue this action during the feeding.  Support your breast with 4 fingers underneath and your thumb above your nipple (make the letter "C" with your hand). Make sure your fingers are well away from your nipple and your baby's mouth.  Stroke your baby's lips gently with your finger or nipple.  When your baby's mouth is open wide enough, quickly bring your baby to your  breast, placing your entire nipple and as much of the areola as possible into your baby's mouth. The areola is the colored area around your nipple. ? More areola should be visible above your baby's upper lip than below the lower lip. ? Your baby's lips should be opened and extended outward (flanged) to ensure an adequate, comfortable latch. ? Your baby's tongue should be between his or her lower gum and your breast.  Make sure that  your baby's mouth is correctly positioned around your nipple (latched). Your baby's lips should create a seal on your breast and be turned out (everted).  It is common for your baby to suck about 2-3 minutes in order to start the flow of breast milk. Latching Teaching your baby how to latch onto your breast properly is very important. An improper latch can cause nipple pain, decreased milk supply, and poor weight gain in your baby. Also, if your baby is not latched onto your nipple properly, he or she may swallow some air during feeding. This can make your baby fussy. Burping your baby when you switch breasts during the feeding can help to get rid of the air. However, teaching your baby to latch on properly is still the best way to prevent fussiness from swallowing air while breastfeeding. Signs that your baby has successfully latched onto your nipple  Silent tugging or silent sucking, without causing you pain. Infant's lips should be extended outward (flanged).  Swallowing heard between every 3-4 sucks once your milk has started to flow (after your let-down milk reflex occurs).  Muscle movement above and in front of his or her ears while sucking. Signs that your baby has not successfully latched onto your nipple  Sucking sounds or smacking sounds from your baby while breastfeeding.  Nipple pain. If you think your baby has not latched on correctly, slip your finger into the corner of your baby's mouth to break the suction and place it between your baby's gums.  Attempt to start breastfeeding again. Signs of successful breastfeeding Signs from your baby  Your baby will gradually decrease the number of sucks or will completely stop sucking.  Your baby will fall asleep.  Your baby's body will relax.  Your baby will retain a small amount of milk in his or her mouth.  Your baby will let go of your breast by himself or herself. Signs from you  Breasts that have increased in firmness, weight, and size 1-3 hours after feeding.  Breasts that are softer immediately after breastfeeding.  Increased milk volume, as well as a change in milk consistency and color by the fifth day of breastfeeding.  Nipples that are not sore, cracked, or bleeding. Signs that your baby is getting enough milk  Wetting at least 1-2 diapers during the first 24 hours after birth.  Wetting at least 5-6 diapers every 24 hours for the first week after birth. The urine should be clear or pale yellow by the age of 5 days.  Wetting 6-8 diapers every 24 hours as your baby continues to grow and develop.  At least 3 stools in a 24-hour period by the age of 5 days. The stool should be soft and yellow.  At least 3 stools in a 24-hour period by the age of 7 days. The stool should be seedy and yellow.  No loss of weight greater than 10% of birth weight during the first 3 days of life.  Average weight gain of 4-7 oz (113-198 g) per week after the age of 4 days.  Consistent daily weight gain by the age of 5 days, without weight loss after the age of 2 weeks. After a feeding, your baby may spit up a small amount of milk. This is normal. Breastfeeding frequency and duration Frequent feeding will help you make more milk and can prevent sore nipples and extremely full breasts (breast engorgement). Breastfeed when you feel the need to reduce the fullness of your breasts or when  your baby shows signs of hunger. This is called "breastfeeding on demand." Signs that your baby is hungry  include:  Increased alertness, activity, or restlessness.  Movement of the head from side to side.  Opening of the mouth when the corner of the mouth or cheek is stroked (rooting).  Increased sucking sounds, smacking lips, cooing, sighing, or squeaking.  Hand-to-mouth movements and sucking on fingers or hands.  Fussing or crying. Avoid introducing a pacifier to your baby in the first 4-6 weeks after your baby is born. After this time, you may choose to use a pacifier. Research has shown that pacifier use during the first year of a baby's life decreases the risk of sudden infant death syndrome (SIDS). Allow your baby to feed on each breast as long as he or she wants. When your baby unlatches or falls asleep while feeding from the first breast, offer the second breast. Because newborns are often sleepy in the first few weeks of life, you may need to awaken your baby to get him or her to feed. Breastfeeding times will vary from baby to baby. However, the following rules can serve as a guide to help you make sure that your baby is properly fed:  Newborns (babies 41 weeks of age or younger) may breastfeed every 1-3 hours.  Newborns should not go without breastfeeding for longer than 3 hours during the day or 5 hours during the night.  You should breastfeed your baby a minimum of 8 times in a 24-hour period. Breast milk pumping     Pumping and storing breast milk allows you to make sure that your baby is exclusively fed your breast milk, even at times when you are unable to breastfeed. This is especially important if you go back to work while you are still breastfeeding, or if you are not able to be present during feedings. Your lactation consultant can help you find a method of pumping that works best for you and give you guidelines about how long it is safe to store breast milk. Caring for your breasts while you breastfeed Nipples can become dry, cracked, and sore while breastfeeding. The  following recommendations can help keep your breasts moisturized and healthy:  Avoid using soap on your nipples.  Wear a supportive bra designed especially for nursing. Avoid wearing underwire-style bras or extremely tight bras (sports bras).  Air-dry your nipples for 3-4 minutes after each feeding.  Use only cotton bra pads to absorb leaked breast milk. Leaking of breast milk between feedings is normal.  Use lanolin on your nipples after breastfeeding. Lanolin helps to maintain your skin's normal moisture barrier. Pure lanolin is not harmful (not toxic) to your baby. You may also hand express a few drops of breast milk and gently massage that milk into your nipples and allow the milk to air-dry. In the first few weeks after giving birth, some women experience breast engorgement. Engorgement can make your breasts feel heavy, warm, and tender to the touch. Engorgement peaks within 3-5 days after you give birth. The following recommendations can help to ease engorgement:  Completely empty your breasts while breastfeeding or pumping. You may want to start by applying warm, moist heat (in the shower or with warm, water-soaked hand towels) just before feeding or pumping. This increases circulation and helps the milk flow. If your baby does not completely empty your breasts while breastfeeding, pump any extra milk after he or she is finished.  Apply ice packs to your breasts immediately after  breastfeeding or pumping, unless this is too uncomfortable for you. To do this: ? Put ice in a plastic bag. ? Place a towel between your skin and the bag. ? Leave the ice on for 20 minutes, 2-3 times a day.  Make sure that your baby is latched on and positioned properly while breastfeeding. If engorgement persists after 48 hours of following these recommendations, contact your health care provider or a Advertising copywriter. Overall health care recommendations while breastfeeding  Eat 3 healthy meals and 3  snacks every day. Well-nourished mothers who are breastfeeding need an additional 450-500 calories a day. You can meet this requirement by increasing the amount of a balanced diet that you eat.  Drink enough water to keep your urine pale yellow or clear.  Rest often, relax, and continue to take your prenatal vitamins to prevent fatigue, stress, and low vitamin and mineral levels in your body (nutrient deficiencies).  Do not use any products that contain nicotine or tobacco, such as cigarettes and e-cigarettes. Your baby may be harmed by chemicals from cigarettes that pass into breast milk and exposure to secondhand smoke. If you need help quitting, ask your health care provider.  Avoid alcohol.  Do not use illegal drugs or marijuana.  Talk with your health care provider before taking any medicines. These include over-the-counter and prescription medicines as well as vitamins and herbal supplements. Some medicines that may be harmful to your baby can pass through breast milk.  It is possible to become pregnant while breastfeeding. If birth control is desired, ask your health care provider about options that will be safe while breastfeeding your baby. Where to find more information: [employer]: www.llli.org Contact a health care provider if:  You feel like you want to stop breastfeeding or have become frustrated with breastfeeding.  Your nipples are cracked or bleeding.  Your breasts are red, tender, or warm.  You have: ? Painful breasts or nipples. ? A swollen area on either breast. ? A fever or chills. ? Nausea or vomiting. ? Drainage other than breast milk from your nipples.  Your breasts do not become full before feedings by the fifth day after you give birth.  You feel sad and depressed.  Your baby is: ? Too sleepy to eat well. ? Having trouble sleeping. ? More than 54 week old and wetting fewer than 6 diapers in a 24-hour period. ? Not gaining weight by 77  days of age.  Your baby has fewer than 3 stools in a 24-hour period.  Your baby's skin or the white parts of his or her eyes become yellow. Get help right away if:  Your baby is overly tired (lethargic) and does not want to wake up and feed.  Your baby develops an unexplained fever. Summary  Breastfeeding offers many health benefits for infant and mothers.  Try to breastfeed your infant when he or she shows early signs of hunger.  Gently tickle or stroke your baby's lips with your finger or nipple to allow the baby to open his or her mouth. Bring the baby to your breast. Make sure that much of the areola is in your baby's mouth. Offer one side and burp the baby before you offer the other side.  Talk with your health care provider or lactation consultant if you have questions or you face problems as you breastfeed. This information is not intended to replace advice given to you by your health care provider. Make sure you discuss any  questions you have with your health care provider. Document Released: 01/09/2005 Document Revised: 02/11/2016 Document Reviewed: 02/11/2016 Elsevier Interactive Patient Education  2019 [employer].   Levonorgestrel intrauterine device (IUD) What is this medicine? LEVONORGESTREL IUD (LEE voe nor jes trel) is a contraceptive (birth control) device. The device is placed inside the uterus by a healthcare professional. It is used to prevent pregnancy. This device can also be used to treat heavy bleeding that occurs during your period. This medicine may be used for other purposes; ask your health care provider or pharmacist if you have questions. COMMON BRAND NAME(S): Cameron Ali What should I tell my health care provider before I take this medicine? They need to know if you have any of these conditions: -abnormal Pap smear -cancer of the breast, uterus, or cervix -diabetes -endometritis -genital or pelvic infection now or in the past  -have more than one sexual partner or your partner has more than one partner -heart disease -history of an ectopic or tubal pregnancy -immune system problems -IUD in place -liver disease or tumor -problems with blood clots or take blood-thinners -seizures -use intravenous drugs -uterus of unusual shape -vaginal bleeding that has not been explained -an unusual or allergic reaction to levonorgestrel, other hormones, silicone, or polyethylene, medicines, foods, dyes, or preservatives -pregnant or trying to get pregnant -breast-feeding How should I use this medicine? This device is placed inside the uterus by a health care professional. Talk to your pediatrician regarding the use of this medicine in children. Special care may be needed. Overdosage: If you think you have taken too much of this medicine contact a poison control center or emergency room at once. NOTE: This medicine is only for you. Do not share this medicine with others. What if I miss a dose? This does not apply. Depending on the brand of device you have inserted, the device will need to be replaced every 3 to 5 years if you wish to continue using this type of birth control. What may interact with this medicine? Do not take this medicine with any of the following medications: -amprenavir -bosentan -fosamprenavir This medicine may also interact with the following medications: -aprepitant -armodafinil -barbiturate medicines for inducing sleep or treating seizures -bexarotene -boceprevir -griseofulvin -medicines to treat seizures like carbamazepine, ethotoin, felbamate, oxcarbazepine, phenytoin, topiramate -modafinil -pioglitazone -rifabutin -rifampin -rifapentine -some medicines to treat HIV infection like atazanavir, efavirenz, indinavir, lopinavir, nelfinavir, tipranavir, ritonavir -St. John's wort -warfarin This list may not describe all possible interactions. Give your health care provider a list of all the  medicines, herbs, non-prescription drugs, or dietary supplements you use. Also tell them if you smoke, drink alcohol, or use illegal drugs. Some items may interact with your medicine. What should I watch for while using this medicine? Visit your doctor or health care professional for regular check ups. See your doctor if you or your partner has sexual contact with others, becomes HIV positive, or gets a sexual transmitted disease. This product does not protect you against HIV infection (AIDS) or other sexually transmitted diseases. You can check the placement of the IUD yourself by reaching up to the top of your vagina with clean fingers to feel the threads. Do not pull on the threads. It is a good habit to check placement after each menstrual period. Call your doctor right away if you feel more of the IUD than just the threads or if you cannot feel the threads at all. The IUD may come out by itself. You  may become pregnant if the device comes out. If you notice that the IUD has come out use a backup birth control method like condoms and call your health care provider. Using tampons will not change the position of the IUD and are okay to use during your period. This IUD can be safely scanned with magnetic resonance imaging (MRI) only under specific conditions. Before you have an MRI, tell your healthcare provider that you have an IUD in place, and which type of IUD you have in place. What side effects may I notice from receiving this medicine? Side effects that you should report to your doctor or health care professional as soon as possible: -allergic reactions like skin rash, itching or hives, swelling of the face, lips, or tongue -fever, flu-like symptoms -genital sores -high blood pressure -no menstrual period for 6 weeks during use -pain, swelling, warmth in the leg -pelvic pain or tenderness -severe or sudden headache -signs of pregnancy -stomach cramping -sudden shortness of breath  -trouble with balance, talking, or walking -unusual vaginal bleeding, discharge -yellowing of the eyes or skin Side effects that usually do not require medical attention (report to your doctor or health care professional if they continue or are bothersome): -acne -breast pain -change in sex drive or performance -changes in weight -cramping, dizziness, or faintness while the device is being inserted -headache -irregular menstrual bleeding within first 3 to 6 months of use -nausea This list may not describe all possible side effects. Call your doctor for medical advice about side effects. You may report side effects to FDA at 1-800-FDA-1088. Where should I keep my medicine? This does not apply. NOTE: This sheet is a summary. It may not cover all possible information. If you have questions about this medicine, talk to your doctor, pharmacist, or health care provider.  2019 Elsevier/Gold Standard (2015-10-22 14:14:56)

## 2018-06-04 NOTE — Progress Notes (Signed)
ROB-No complaints.  Unable to do GTT today, declines Tdap.

## 2018-06-04 NOTE — Progress Notes (Signed)
ROB-Doing well, no questions or concerns. Rx: Aspirin, see orders. Will start due to history of gestational hypertension. Plans epidural, breastfeeding and Mirena as contraception. Reviewed red flag symptoms and when to call. RTC x 2 weeks for 28 wk labs and ROB or sooner if needed.   ULTRASOUND REPORT  Location: [facility] Date of Service: 06/04/2018     Findings:  Shelby Kelly intrauterine pregnancy is visualized with FHR at 124 BPM.    Fetal presentation is Cephalic.  Placenta: anterior. Grade: 1 AFI: 11.8 cm  BPP Scoring: Movement: 2/2  Tone: 2/2  Breathing: 2/2  AFI: 2/2  Impression: 1. [redacted]w[redacted]d Viable Singleton Intrauterine pregnancy dated by previously established criteria. 2. AFI is 11.8 cm.  3. BPP is 8/8  Recommendations: 1.Clinical correlation with the patient's History and Physical Exam.

## 2018-06-12 ENCOUNTER — Telehealth: Payer: Self-pay | Admitting: Certified Nurse Midwife

## 2018-06-12 NOTE — Telephone Encounter (Signed)
The patient's pregnancy care manager called and stated that the patient called with a few concerns. The patient is experiencing the feeling of fainting when in the shower and cooking, Concerns with blood pressure and baby Asprin, Along with pain in her hip that is prohibiting her from sleeping. Please advise.

## 2018-06-20 ENCOUNTER — Telehealth: Payer: Self-pay

## 2018-06-20 NOTE — Telephone Encounter (Signed)
Coronavirus (COVID-19) Are you at risk?  Are you at risk for the Coronavirus (COVID-19)?  To be considered HIGH RISK for Coronavirus (COVID-19), you have to meet the following criteria:  . Traveled to [place], [place], [place], [place] or [place]; or in the [place] to [place], [place], [place], or [place]; and have fever, cough, and shortness of breath within the last 2 weeks of travel OR . Been in close contact with a person diagnosed with COVID-19 within the last 2 weeks and have fever, cough, and shortness of breath . IF YOU DO NOT MEET THESE CRITERIA, YOU ARE CONSIDERED LOW RISK FOR COVID-19.  What to do if you are HIGH RISK for COVID-19?  . If you are having a medical emergency, call 911. . Seek medical care right away. Before you go to a doctor's office, urgent care or emergency department, call ahead and tell them about your recent travel, contact with someone diagnosed with COVID-19, and your symptoms. You should receive instructions from your physician's office regarding next steps of care.  . When you arrive at healthcare provider, tell the healthcare staff immediately you have returned from visiting [place], [place], [place], [place] or [place]; or traveled in the [place] to [place], [place], [place], or [place]; in the last two weeks or you have been in close contact with a person diagnosed with COVID-19 in the last 2 weeks.   . Tell the health care staff about your symptoms: fever, cough and shortness of breath. . After you have been seen by a medical provider, you will be either: o Tested for (COVID-19) and discharged home on quarantine except to seek medical care if symptoms worsen, and asked to  - Stay home and avoid contact with others until you get your results (4-5 days)  - Avoid travel on public transportation if possible (such as bus, train, or airplane) or o Sent to the Emergency Department by EMS for evaluation, COVID-19 testing, and possible  admission depending on your condition and test results.  What to do if you are LOW RISK for COVID-19?  Reduce your risk of any infection by using the same precautions used for avoiding the common cold or flu:  . Wash your hands often with soap and warm water for at least 20 seconds.  If soap and water are not readily available, use an alcohol-based hand sanitizer with at least 60% alcohol.  . If coughing or sneezing, cover your mouth and nose by coughing or sneezing into the elbow areas of your shirt or coat, into a tissue or into your sleeve (not your hands). . Avoid shaking hands with others and consider head nods or verbal greetings only. . Avoid touching your eyes, nose, or mouth with unwashed hands.  . Avoid close contact with people who are Carmelita Amparo. . Avoid places or events with large numbers of people in one location, like concerts or sporting events. . Carefully consider travel plans you have or are making. . If you are planning any travel outside or inside the [place], visit the CDC's Travelers' Health webpage for the latest health notices. . If you have some symptoms but not all symptoms, continue to monitor at home and seek medical attention if your symptoms worsen. . If you are having a medical emergency, call 911.  06/20/18 SCREENING NEG SLS ADDITIONAL HEALTHCARE OPTIONS FOR PATIENTS  [facility] / e-Visit: [email or web address]         [facility]: [phone]    [facility]: [phone]                   [facility]: [phone]  

## 2018-06-21 ENCOUNTER — Encounter: Payer: Self-pay | Admitting: Certified Nurse Midwife

## 2018-06-21 ENCOUNTER — Ambulatory Visit (INDEPENDENT_AMBULATORY_CARE_PROVIDER_SITE_OTHER): Payer: Medicaid Other | Admitting: Certified Nurse Midwife

## 2018-06-21 ENCOUNTER — Other Ambulatory Visit: Payer: Self-pay

## 2018-06-21 ENCOUNTER — Other Ambulatory Visit: Payer: Medicaid Other

## 2018-06-21 VITALS — BP 98/46 | HR 82 | Wt 114.0 lb

## 2018-06-21 DIAGNOSIS — Z3493 Encounter for supervision of normal pregnancy, unspecified, third trimester: Secondary | ICD-10-CM

## 2018-06-21 LAB — POCT URINALYSIS DIPSTICK OB
Bilirubin, UA: NEGATIVE
Glucose, UA: NEGATIVE
Ketones, UA: NEGATIVE
Leukocytes, UA: NEGATIVE
Nitrite, UA: NEGATIVE
POC,PROTEIN,UA: NEGATIVE
Spec Grav, UA: 1.02 (ref 1.010–1.025)
Urobilinogen, UA: 0.2 E.U./dL
pH, UA: 7 (ref 5.0–8.0)

## 2018-06-21 MED ORDER — TETANUS-DIPHTH-ACELL PERTUSSIS 5-2.5-18.5 LF-MCG/0.5 IM SUSP
0.5000 mL | Freq: Once | INTRAMUSCULAR | Status: AC
  Administered 2018-06-21: 0.5 mL via INTRAMUSCULAR

## 2018-06-21 NOTE — Progress Notes (Signed)
ROB doing well. Glucose/CBC/RPR/Tdap/BTC today. Discussed BC and she is thinking about patch. Pamphlet given. Birth plan discussed. Plan epidural- sample birth plan given. Follow up 2 wk.   Doreene Burke, CNM

## 2018-06-21 NOTE — Patient Instructions (Signed)
Td Vaccine (Tetanus and Diphtheria): What You Need to Know °1. Why get vaccinated? °Tetanus  and diphtheria are very serious diseases. They are rare in the [place] today, but people who do become infected often have severe complications. Td vaccine is used to protect adolescents and adults from both of these diseases. °Both tetanus and diphtheria are infections caused by bacteria. Diphtheria spreads from person to person through coughing or sneezing. Tetanus-causing bacteria enter the body through cuts, scratches, or wounds. °TETANUS (Lockjaw) causes painful muscle tightening and stiffness, usually all over the body. °· It can lead to tightening of muscles in the head and neck so you can't open your mouth, swallow, or sometimes even breathe. Tetanus kills about 1 out of every 10 people who are infected even after receiving the best medical care. °DIPHTHERIA can cause a thick coating to form in the back of the throat. °· It can lead to breathing problems, paralysis, heart failure, and death. °Before vaccines, as many as 200,000 cases of diphtheria and hundreds of cases of tetanus were reported in the [place] each year. Since vaccination began, reports of cases for both diseases have dropped by about 99%. °2. Td vaccine °Td vaccine can protect adolescents and adults from tetanus and diphtheria. Td is usually given as a booster dose every 10 years but it can also be given earlier after a severe and dirty wound or burn. °Another vaccine, called Tdap, which protects against pertussis in addition to tetanus and diphtheria, is sometimes recommended instead of Td vaccine. °Your doctor or the person giving you the vaccine can give you more information. °Td may safely be given at the same time as other vaccines. °3. Some people should not get this vaccine °· A person who has ever had a life-threatening allergic reaction after a previous dose of any tetanus or diphtheria containing vaccine, OR has a severe allergy  to any part of this vaccine, should not get Td vaccine. Tell the person giving the vaccine about any severe allergies. °· Talk to your doctor if you: °? had severe pain or swelling after any vaccine containing diphtheria or tetanus, °? ever had a condition called Guillain Barré Syndrome (GBS), °? aren't feeling well on the day the shot is scheduled. °4. Risks of a vaccine reaction °With any medicine, including vaccines, there is a chance of side effects. These are usually mild and go away on their own. Serious reactions are also possible but are rare. °Most people who get Td vaccine do not have any problems with it. °Mild Problems following Td vaccine: °(Did not interfere with activities) °· Pain where the shot was given (about 8 people in 10) °· Redness or swelling where the shot was given (about 1 person in 4) °· Mild fever (rare) °· Headache (about 1 person in 4) °· Tiredness (about 1 person in 4) °Moderate Problems following Td vaccine: °(Interfered with activities, but did not require medical attention) °· Fever over 102°F (rare) °Severe Problems following Td vaccine: °(Unable to perform usual activities; required medical attention) °· Swelling, severe pain, bleeding and/or redness in the arm where the shot was given (rare). °Problems that could happen after any vaccine: °· People sometimes faint after a medical procedure, including vaccination. Sitting or lying down for about 15 minutes can help prevent fainting, and injuries caused by a fall. Tell your doctor if you feel dizzy, or have vision changes or ringing in the ears. °· Some people get severe pain in the shoulder and have   difficulty moving the arm where a shot was given. This happens very rarely. °· Any medication can cause a severe allergic reaction. Such reactions from a vaccine are very rare, estimated at fewer than 1 in a million doses, and would happen within a few minutes to a few hours after the vaccination. °As with any medicine, there is a  very remote chance of a vaccine causing a serious injury or death. °The safety of vaccines is always being monitored. For more information, visit: www.cdc.gov/vaccinesafety/ °5. What if there is a serious reaction? °What should I look for? °· Look for anything that concerns you, such as signs of a severe allergic reaction, very high fever, or unusual behavior. °Signs of a severe allergic reaction can include hives, swelling of the face and throat, difficulty breathing, a fast heartbeat, dizziness, and weakness. These would usually start a few minutes to a few hours after the vaccination. °What should I do? °· If you think it is a severe allergic reaction or other emergency that can't wait, call 9-1-1 or get the person to the nearest hospital. Otherwise, call your doctor. °· Afterward, the reaction should be reported to the Vaccine Adverse Event Reporting System (VAERS). Your doctor might file this report, or you can do it yourself through the VAERS web site at www.vaers.hhs.gov, or by calling 1-800-822-7967. °VAERS does not give medical advice. °6. The National Vaccine Injury Compensation Program °The National Vaccine Injury Compensation Program (VICP) is a federal program that was created to compensate people who may have been injured by certain vaccines. °Persons who believe they may have been injured by a vaccine can learn about the program and about filing a claim by calling 1-800-338-2382 or visiting the VICP website at www.hrsa.gov/vaccinecompensation. There is a time limit to file a claim for compensation. °7. How can I learn more? °· Ask your doctor. He or she can give you the vaccine package insert or suggest other sources of information. °· Call your local or state health department. °· Contact the Centers for Disease Control and Prevention (CDC): °? Call 1-800-232-4636 (1-800-CDC-INFO) °? Visit CDC's website at www.cdc.gov/vaccines °Vaccine Information Statement Td Vaccine (05/04/15) °This information is  not intended to replace advice given to you by your health care provider. Make sure you discuss any questions you have with your health care provider. °Document Released: 11/06/2005 Document Revised: 08/27/2017 Document Reviewed: 08/27/2017 °Elsevier Interactive Patient Education © 2019 [employer] °Glucose Tolerance Test During Pregnancy °Why am I having this test? °The glucose tolerance test (GTT) is done to check how your body processes sugar (glucose). This is one of several tests used to diagnose diabetes that develops during pregnancy (gestational diabetes mellitus). Gestational diabetes is a temporary form of diabetes that some women develop during pregnancy. It usually occurs during the second trimester of pregnancy and goes away after delivery. °Testing (screening) for gestational diabetes usually occurs between 24 and 28 weeks of pregnancy. You may have the GTT test after having a 1-hour glucose screening test if the results from that test indicate that you may have gestational diabetes. You may also have this test if: °· You have a history of gestational diabetes. °· You have a history of giving birth to very large babies or have experienced repeated fetal loss (stillbirth). °· You have signs and symptoms of diabetes, such as: °? Changes in your vision. °? Tingling or numbness in your hands or feet. °? Changes in hunger, thirst, and urination that are not otherwise explained by your pregnancy. °  What is being tested? °This test measures the amount of glucose in your blood at different times during a period of 3 hours. This indicates how well your body is able to process glucose. °What kind of sample is taken? ° °Blood samples are required for this test. They are usually collected by inserting a needle into a blood vessel. °How do I prepare for this test? °· For 3 days before your test, eat normally. Have plenty of carbohydrate-rich foods. °· Follow instructions from your health care provider  about: °? Eating or drinking restrictions on the day of the test. You may be asked to not eat or drink anything other than water (fast) starting 8-10 hours before the test. °? Changing or stopping your regular medicines. Some medicines may interfere with this test. °Tell a health care provider about: °· All medicines you are taking, including vitamins, herbs, eye drops, creams, and over-the-counter medicines. °· Any blood disorders you have. °· Any surgeries you have had. °· Any medical conditions you have. °What happens during the test? °First, your blood glucose will be measured. This is referred to as your fasting blood glucose, since you fasted before the test. Then, you will drink a glucose solution that contains a certain amount of glucose. Your blood glucose will be measured again 1, 2, and 3 hours after drinking the solution. °This test takes about 3 hours to complete. You will need to stay at the testing location during this time. During the testing period: °· Do not eat or drink anything other than the glucose solution. °· Do not exercise. °· Do not use any products that contain nicotine or tobacco, such as cigarettes and e-cigarettes. If you need help stopping, ask your health care provider. °The testing procedure may vary among health care providers and hospitals. °How are the results reported? °Your results will be reported as milligrams of glucose per deciliter of blood (mg/dL) or millimoles per liter (mmol/L). Your health care provider will compare your results to normal ranges that were established after testing a large group of people (reference ranges). Reference ranges may vary among labs and hospitals. For this test, common reference ranges are: °· Fasting: less than 95-105 mg/dL (5.3-5.8 mmol/L). °· 1 hour after drinking glucose: less than 180-190 mg/dL (10.0-10.5 mmol/L). °· 2 hours after drinking glucose: less than 155-165 mg/dL (8.6-9.2 mmol/L). °· 3 hours after drinking glucose: 140-145  mg/dL (7.8-8.1 mmol/L). °What do the results mean? °Results within reference ranges are considered normal, meaning that your glucose levels are well-controlled. If two or more of your blood glucose levels are high, you may be diagnosed with gestational diabetes. If only one level is high, your health care provider may suggest repeat testing or other tests to confirm a diagnosis. °Talk with your health care provider about what your results mean. °Questions to ask your health care provider °Ask your health care provider, or the department that is doing the test: °· When will my results be ready? °· How will I get my results? °· What are my treatment options? °· What other tests do I need? °· What are my next steps? °Summary °· The glucose tolerance test (GTT) is one of several tests used to diagnose diabetes that develops during pregnancy (gestational diabetes mellitus). Gestational diabetes is a temporary form of diabetes that some women develop during pregnancy. °· You may have the GTT test after having a 1-hour glucose screening test if the results from that test indicate that you may have gestational diabetes.   You may also have this test if you have any symptoms or risk factors for gestational diabetes. °· Talk with your health care provider about what your results mean. °This information is not intended to replace advice given to you by your health care provider. Make sure you discuss any questions you have with your health care provider. °Document Released: 07/11/2011 Document Revised: 08/21/2016 Document Reviewed: 08/21/2016 °Elsevier Interactive Patient Education © 2019 [employer] Inc. ° °

## 2018-06-21 NOTE — Addendum Note (Signed)
Addended by: Brooke Dare on: 06/21/2018 11:43 AM   Modules accepted: Orders

## 2018-06-22 LAB — GLUCOSE, 1 HOUR GESTATIONAL: Gestational Diabetes Screen: 93 mg/dL (ref 65–139)

## 2018-06-22 LAB — CBC
Hematocrit: 36.9 % (ref 34.0–46.6)
Hemoglobin: 12.6 g/dL (ref 11.1–15.9)
MCH: 28.8 pg (ref 26.6–33.0)
MCHC: 34.1 g/dL (ref 31.5–35.7)
MCV: 84 fL (ref 79–97)
Platelets: 201 10*3/uL (ref 150–450)
RBC: 4.37 x10E6/uL (ref 3.77–5.28)
RDW: 12.2 % (ref 11.7–15.4)
WBC: 9.8 10*3/uL (ref 3.4–10.8)

## 2018-06-22 LAB — RPR: RPR Ser Ql: NONREACTIVE

## 2018-07-04 ENCOUNTER — Telehealth: Payer: Self-pay | Admitting: *Deleted

## 2018-07-04 NOTE — Telephone Encounter (Signed)
Coronavirus (COVID-19) Are you at risk?  Are you at risk for the Coronavirus (COVID-19)?  To be considered HIGH RISK for Coronavirus (COVID-19), you have to meet the following criteria:  . Traveled to [place], [place], [place], [place] or [place]; or in the [place] to [place], [place], [place], or [place]; and have fever, cough, and shortness of breath within the last 2 weeks of travel OR . Been in close contact with a person diagnosed with COVID-19 within the last 2 weeks and have fever, cough, and shortness of breath . IF YOU DO NOT MEET THESE CRITERIA, YOU ARE CONSIDERED LOW RISK FOR COVID-19.  What to do if you are HIGH RISK for COVID-19?  . If you are having a medical emergency, call 911. . Seek medical care right away. Before you go to a doctor's office, urgent care or emergency department, call ahead and tell them about your recent travel, contact with someone diagnosed with COVID-19, and your symptoms. You should receive instructions from your physician's office regarding next steps of care.  . When you arrive at healthcare provider, tell the healthcare staff immediately you have returned from visiting [place], [place], [place], [place] or [place]; or traveled in the [place] to [place], [place], [place], or [place]; in the last two weeks or you have been in close contact with a person diagnosed with COVID-19 in the last 2 weeks.   . Tell the health care staff about your symptoms: fever, cough and shortness of breath. . After you have been seen by a medical provider, you will be either: o Tested for (COVID-19) and discharged home on quarantine except to seek medical care if symptoms worsen, and asked to  - Stay home and avoid contact with others until you get your results (4-5 days)  - Avoid travel on public transportation if possible (such as bus, train, or airplane) or o Sent to the Emergency Department by EMS for evaluation, COVID-19 testing, and possible  admission depending on your condition and test results.  What to do if you are LOW RISK for COVID-19?  Reduce your risk of any infection by using the same precautions used for avoiding the common cold or flu:  . Wash your hands often with soap and warm water for at least 20 seconds.  If soap and water are not readily available, use an alcohol-based hand sanitizer with at least 60% alcohol.  . If coughing or sneezing, cover your mouth and nose by coughing or sneezing into the elbow areas of your shirt or coat, into a tissue or into your sleeve (not your hands). . Avoid shaking hands with others and consider head nods or verbal greetings only. . Avoid touching your eyes, nose, or mouth with unwashed hands.  . Avoid close contact with people who are sick. . Avoid places or events with large numbers of people in one location, like concerts or sporting events. . Carefully consider travel plans you have or are making. . If you are planning any travel outside or inside the [place], visit the CDC's Travelers' Health webpage for the latest health notices. . If you have some symptoms but not all symptoms, continue to monitor at home and seek medical attention if your symptoms worsen. . If you are having a medical emergency, call 911.   ADDITIONAL HEALTHCARE OPTIONS FOR PATIENTS  [facility] / e-Visit: [email or web address]         [facility]: [phone]  [facility]: [phone]                   [facility]: [phone]   Spoke with pt denies any sx.  Amy Clontz, CMA 

## 2018-07-05 ENCOUNTER — Ambulatory Visit (INDEPENDENT_AMBULATORY_CARE_PROVIDER_SITE_OTHER): Payer: Medicaid Other | Admitting: Obstetrics and Gynecology

## 2018-07-05 ENCOUNTER — Other Ambulatory Visit: Payer: Self-pay

## 2018-07-05 VITALS — BP 100/63 | HR 101 | Wt 115.5 lb

## 2018-07-05 DIAGNOSIS — Z3493 Encounter for supervision of normal pregnancy, unspecified, third trimester: Secondary | ICD-10-CM

## 2018-07-05 LAB — POCT URINALYSIS DIPSTICK OB
Bilirubin, UA: NEGATIVE
Blood, UA: NEGATIVE
Glucose, UA: NEGATIVE
Ketones, UA: NEGATIVE
Leukocytes, UA: NEGATIVE
Nitrite, UA: NEGATIVE
POC,PROTEIN,UA: NEGATIVE
Spec Grav, UA: 1.02 (ref 1.010–1.025)
Urobilinogen, UA: 0.2 E.U./dL
pH, UA: 6.5 (ref 5.0–8.0)

## 2018-07-05 NOTE — Progress Notes (Signed)
ROB- pt is having pain in her R hip

## 2018-07-05 NOTE — Progress Notes (Signed)
ROB- doing well,right hip pain- will reach out to chiropractor.

## 2018-07-18 ENCOUNTER — Telehealth: Payer: Self-pay

## 2018-07-18 NOTE — Telephone Encounter (Signed)
Coronavirus (COVID-19) Are you at risk?  Are you at risk for the Coronavirus (COVID-19)?  To be considered HIGH RISK for Coronavirus (COVID-19), you have to meet the following criteria:  . Traveled to [place], [place], [place], [place] or [place]; or in the [place] to [place], [place], [place], or [place]; and have fever, cough, and shortness of breath within the last 2 weeks of travel OR . Been in close contact with a person diagnosed with COVID-19 within the last 2 weeks and have fever, cough, and shortness of breath . IF YOU DO NOT MEET THESE CRITERIA, YOU ARE CONSIDERED LOW RISK FOR COVID-19.  What to do if you are HIGH RISK for COVID-19?  . If you are having a medical emergency, call 911. . Seek medical care right away. Before you go to a doctor's office, urgent care or emergency department, call ahead and tell them about your recent travel, contact with someone diagnosed with COVID-19, and your symptoms. You should receive instructions from your physician's office regarding next steps of care.  . When you arrive at healthcare provider, tell the healthcare staff immediately you have returned from visiting [place], [place], [place], [place] or [place]; or traveled in the [place] to [place], [place], [place], or [place]; in the last two weeks or you have been in close contact with a person diagnosed with COVID-19 in the last 2 weeks.   . Tell the health care staff about your symptoms: fever, cough and shortness of breath. . After you have been seen by a medical provider, you will be either: o Tested for (COVID-19) and discharged home on quarantine except to seek medical care if symptoms worsen, and asked to  - Stay home and avoid contact with others until you get your results (4-5 days)  - Avoid travel on public transportation if possible (such as bus, train, or airplane) or o Sent to the Emergency Department by EMS for evaluation, COVID-19 testing, and possible  admission depending on your condition and test results.  What to do if you are LOW RISK for COVID-19?  Reduce your risk of any infection by using the same precautions used for avoiding the common cold or flu:  . Wash your hands often with soap and warm water for at least 20 seconds.  If soap and water are not readily available, use an alcohol-based hand sanitizer with at least 60% alcohol.  . If coughing or sneezing, cover your mouth and nose by coughing or sneezing into the elbow areas of your shirt or coat, into a tissue or into your sleeve (not your hands). . Avoid shaking hands with others and consider head nods or verbal greetings only. . Avoid touching your eyes, nose, or mouth with unwashed hands.  . Avoid close contact with people who are sick. . Avoid places or events with large numbers of people in one location, like concerts or sporting events. . Carefully consider travel plans you have or are making. . If you are planning any travel outside or inside the [place], visit the CDC's Travelers' Health webpage for the latest health notices. . If you have some symptoms but not all symptoms, continue to monitor at home and seek medical attention if your symptoms worsen. . If you are having a medical emergency, call 911.   ADDITIONAL HEALTHCARE OPTIONS FOR PATIENTS  [facility] / e-Visit: [email or web address]         [facility]: [phone]  [facility]: [phone]                   [facility]: [phone]   Pre-screen negative, DM.   

## 2018-07-19 ENCOUNTER — Other Ambulatory Visit: Payer: Self-pay

## 2018-07-19 ENCOUNTER — Ambulatory Visit (INDEPENDENT_AMBULATORY_CARE_PROVIDER_SITE_OTHER): Payer: Medicaid Other | Admitting: Certified Nurse Midwife

## 2018-07-19 ENCOUNTER — Other Ambulatory Visit (HOSPITAL_COMMUNITY)
Admission: RE | Admit: 2018-07-19 | Discharge: 2018-07-19 | Disposition: A | Discharge status: Home / Self Care | Payer: Medicaid Other | Source: Ambulatory Visit | Attending: Certified Nurse Midwife | Admitting: Certified Nurse Midwife

## 2018-07-19 VITALS — BP 94/62 | HR 102 | Wt 117.8 lb

## 2018-07-19 DIAGNOSIS — N898 Other specified noninflammatory disorders of vagina: Secondary | ICD-10-CM | POA: Diagnosis present

## 2018-07-19 DIAGNOSIS — O26893 Other specified pregnancy related conditions, third trimester: Secondary | ICD-10-CM

## 2018-07-19 DIAGNOSIS — Z3493 Encounter for supervision of normal pregnancy, unspecified, third trimester: Secondary | ICD-10-CM | POA: Insufficient documentation

## 2018-07-19 LAB — POCT URINALYSIS DIPSTICK OB
Bilirubin, UA: NEGATIVE
Blood, UA: NEGATIVE
Glucose, UA: NEGATIVE
Ketones, UA: NEGATIVE
Nitrite, UA: NEGATIVE
Spec Grav, UA: 1.02 (ref 1.010–1.025)
Urobilinogen, UA: 0.2 E.U./dL
pH, UA: 6.5 (ref 5.0–8.0)

## 2018-07-19 NOTE — Progress Notes (Signed)
ROB-Reports increased vaginal discharge and spotting after intercourse. Vaginal swab collected, see orders. Anticipatory guidance regarding course of prenatal care. Reviewed red flag symptoms and when to call. RTC x 2 wks for ROB or sooner if needed.

## 2018-07-19 NOTE — Progress Notes (Signed)
ROB-Patient c/o thick vaginal d/c with small amount of blood that started 3 days ago after intercourse.

## 2018-07-19 NOTE — Patient Instructions (Signed)

## 2018-07-23 LAB — CERVICOVAGINAL ANCILLARY ONLY
Bacterial vaginitis: NEGATIVE
Candida vaginitis: POSITIVE — AB
Chlamydia: NEGATIVE
Neisseria Gonorrhea: NEGATIVE
Trichomonas: NEGATIVE

## 2018-07-25 ENCOUNTER — Other Ambulatory Visit: Payer: Self-pay

## 2018-07-25 ENCOUNTER — Encounter: Payer: Self-pay | Admitting: Certified Nurse Midwife

## 2018-07-25 MED ORDER — FLUCONAZOLE 150 MG PO TABS: 150.0000 mg | ORAL_TABLET | Freq: Once | ORAL | 0 refills | Status: DC

## 2018-07-30 ENCOUNTER — Telehealth: Payer: Self-pay

## 2018-07-30 NOTE — Telephone Encounter (Signed)
Coronavirus (COVID-19) Are you at risk?  Are you at risk for the Coronavirus (COVID-19)?  To be considered HIGH RISK for Coronavirus (COVID-19), you have to meet the following criteria:  . Traveled to [place], [place], [place], [place] or [place]; or in the [place] to [place], [place], [place], or [place]; and have fever, cough, and shortness of breath within the last 2 weeks of travel OR . Been in close contact with a person diagnosed with COVID-19 within the last 2 weeks and have fever, cough, and shortness of breath . IF YOU DO NOT MEET THESE CRITERIA, YOU ARE CONSIDERED LOW RISK FOR COVID-19.  What to do if you are HIGH RISK for COVID-19?  . If you are having a medical emergency, call 911. . Seek medical care right away. Before you go to a doctor's office, urgent care or emergency department, call ahead and tell them about your recent travel, contact with someone diagnosed with COVID-19, and your symptoms. You should receive instructions from your physician's office regarding next steps of care.  . When you arrive at healthcare provider, tell the healthcare staff immediately you have returned from visiting [place], [place], [place], [place] or [place]; or traveled in the [place] to [place], [place], [place], or [place]; in the last two weeks or you have been in close contact with a person diagnosed with COVID-19 in the last 2 weeks.   . Tell the health care staff about your symptoms: fever, cough and shortness of breath. . After you have been seen by a medical provider, you will be either: o Tested for (COVID-19) and discharged home on quarantine except to seek medical care if symptoms worsen, and asked to  - Stay home and avoid contact with others until you get your results (4-5 days)  - Avoid travel on public transportation if possible (such as bus, train, or airplane) or o Sent to the Emergency Department by EMS for evaluation, COVID-19 testing, and possible  admission depending on your condition and test results.  What to do if you are LOW RISK for COVID-19?  Reduce your risk of any infection by using the same precautions used for avoiding the common cold or flu:  . Wash your hands often with soap and warm water for at least 20 seconds.  If soap and water are not readily available, use an alcohol-based hand sanitizer with at least 60% alcohol.  . If coughing or sneezing, cover your mouth and nose by coughing or sneezing into the elbow areas of your shirt or coat, into a tissue or into your sleeve (not your hands). . Avoid shaking hands with others and consider head nods or verbal greetings only. . Avoid touching your eyes, nose, or mouth with unwashed hands.  . Avoid close contact with people who are [place]. . Avoid places or events with large numbers of people in one location, like concerts or sporting events. . Carefully consider travel plans you have or are making. . If you are planning any travel outside or inside the [place], visit the CDC's Travelers' Health webpage for the latest health notices. . If you have some symptoms but not all symptoms, continue to monitor at home and seek medical attention if your symptoms worsen. . If you are having a medical emergency, call 911.  07/30/18 SCREENING NEG SLS ADDITIONAL HEALTHCARE OPTIONS FOR PATIENTS  [facility] / e-Visit: [email or web address]         [facility]: [phone]    [facility]: [phone]                   [facility]: [phone]  

## 2018-07-31 ENCOUNTER — Ambulatory Visit (INDEPENDENT_AMBULATORY_CARE_PROVIDER_SITE_OTHER): Payer: Medicaid Other | Admitting: Certified Nurse Midwife

## 2018-07-31 ENCOUNTER — Encounter: Payer: Self-pay | Admitting: Certified Nurse Midwife

## 2018-07-31 ENCOUNTER — Other Ambulatory Visit: Payer: Self-pay

## 2018-07-31 VITALS — BP 88/55 | HR 112 | Wt 118.2 lb

## 2018-07-31 DIAGNOSIS — Z3483 Encounter for supervision of other normal pregnancy, third trimester: Secondary | ICD-10-CM

## 2018-07-31 LAB — POCT URINALYSIS DIPSTICK OB
Bilirubin, UA: NEGATIVE
Blood, UA: NEGATIVE
Glucose, UA: NEGATIVE
Ketones, UA: NEGATIVE
Leukocytes, UA: NEGATIVE
Nitrite, UA: NEGATIVE
POC,PROTEIN,UA: NEGATIVE
Spec Grav, UA: 1.015 (ref 1.010–1.025)
Urobilinogen, UA: 0.2 E.U./dL
pH, UA: 6 (ref 5.0–8.0)

## 2018-07-31 NOTE — Patient Instructions (Signed)
Group B Streptococcus Infection During Pregnancy  Group B Streptococcus (GBS) is a type of bacteria (Streptococcus agalactiae) that is often found in healthy people, commonly in the rectum, vagina, and intestines. In people who are healthy and not pregnant, the bacteria rarely cause serious illness or complications. However, women who test positive for GBS during pregnancy can pass the bacteria to their baby during childbirth, which can cause serious infection in the baby after birth. Women with GBS may also have infections during their pregnancy or immediately after childbirth, such as urinary tract infections (UTIs) or infections of the uterus (uterine infections). Having GBS also increases a woman's risk of complications during pregnancy, such as early (preterm) labor or delivery, miscarriage, or stillbirth. Routine testing (screening) for GBS is recommended for all pregnant women. What increases the risk? You may have a higher risk for GBS infection during pregnancy if you had one during a past pregnancy. What are the signs or symptoms? In most cases, GBS infection does not cause symptoms in pregnant women. Signs and symptoms of a possible GBS-related infection may include:  Labor starting before the 37th week of pregnancy.  A UTI or bladder infection, which may cause: ? Fever. ? Pain or burning during urination. ? Frequent urination.  Fever during labor, along with: ? Bad-smelling discharge. ? Uterine tenderness. ? Rapid heartbeat in the mother, baby, or both. Rare but serious symptoms of a possible GBS-related infection in women include:  Blood infection (septicemia). This may cause fever, chills, or confusion.  Lung infection (pneumonia). This may cause fever, chills, cough, rapid breathing, difficulty breathing, or chest pain.  Bone, joint, skin, or soft tissue infection. How is this diagnosed? You may be screened for GBS between week 35 and week 37 of your pregnancy. If you have  symptoms of preterm labor, you may be screened earlier. This condition is diagnosed based on lab test results from:  A swab of fluid from the vagina and rectum.  A urine sample. How is this treated? This condition is treated with antibiotic medicine. When you go into labor, or as soon as your water breaks (your membranes rupture), you will be given antibiotics through an IV tube. Antibiotics will continue until after you give birth. If you are having a cesarean delivery, you do not need antibiotics unless your membranes have already ruptured. Follow these instructions at home:  Take over-the-counter and prescription medicines only as told by your health care provider.  Take your antibiotic medicine as told by your health care provider. Do not stop taking the antibiotic even if you start to feel better.  Keep all pre-birth (prenatal) visits and follow-up visits as told by your health care provider. This is important. Contact a health care provider if:  You have pain or burning when you urinate.  You have to urinate frequently.  You have a fever or chills.  You develop a bad-smelling vaginal discharge. Get help right away if:  Your membranes rupture.  You go into labor.  You have severe pain in your abdomen.  You have difficulty breathing.  You have chest pain. This information is not intended to replace advice given to you by your health care provider. Make sure you discuss any questions you have with your health care provider. Document Released: 04/18/2007 Document Revised: 05/02/2018 Document Reviewed: 08/05/2015 Elsevier Patient Education  2020 Elsevier Inc.  

## 2018-07-31 NOTE — Progress Notes (Signed)
ROB doing well. Feels good movement. Unsure of BC, thinking about IUD. She state she is worried about pain with placement. Informed her that I could order her a percocet or tylenol with codine to take at home prior to placement. She seems interested in this. Reviewed gbs at next visit. Follow up 1 wks.   Philip Aspen, CNM

## 2018-07-31 NOTE — Addendum Note (Signed)
Addended by: Raliegh Ip on: 07/31/2018 04:19 PM   Modules accepted: Orders

## 2018-08-08 ENCOUNTER — Ambulatory Visit (INDEPENDENT_AMBULATORY_CARE_PROVIDER_SITE_OTHER): Payer: Medicaid Other | Admitting: Certified Nurse Midwife

## 2018-08-08 ENCOUNTER — Other Ambulatory Visit: Payer: Self-pay

## 2018-08-08 VITALS — BP 99/62 | HR 97 | Wt 120.2 lb

## 2018-08-08 DIAGNOSIS — Z3493 Encounter for supervision of normal pregnancy, unspecified, third trimester: Secondary | ICD-10-CM

## 2018-08-08 DIAGNOSIS — Z113 Encounter for screening for infections with a predominantly sexual mode of transmission: Secondary | ICD-10-CM

## 2018-08-08 DIAGNOSIS — Z3685 Encounter for antenatal screening for Streptococcus B: Secondary | ICD-10-CM

## 2018-08-08 LAB — POCT URINALYSIS DIPSTICK OB
Bilirubin, UA: NEGATIVE
Blood, UA: NEGATIVE
Glucose, UA: NEGATIVE
Ketones, UA: NEGATIVE
Nitrite, UA: NEGATIVE
POC,PROTEIN,UA: NEGATIVE
Spec Grav, UA: 1.015 (ref 1.010–1.025)
Urobilinogen, UA: 0.2 E.U./dL
pH, UA: 7 (ref 5.0–8.0)

## 2018-08-08 LAB — OB RESULTS CONSOLE GC/CHLAMYDIA: Gonorrhea: NEGATIVE

## 2018-08-08 NOTE — Patient Instructions (Signed)
Common Medications Safe in Pregnancy  Acne:      Constipation:  Benzoyl Peroxide     Colace  Clindamycin      Dulcolax Suppository  Topica Erythromycin     Fibercon  Salicylic Acid      Metamucil         Miralax AVOID:        Senakot   Accutane    Cough:  Retin-A       Cough Drops  Tetracycline      Phenergan w/ Codeine if Rx  Minocycline      Robitussin (Plain & DM)  Antibiotics:     Crabs/Lice:  Ceclor       RID  Cephalosporins    AVOID:  E-Mycins      Kwell  Keflex  Macrobid/Macrodantin   Diarrhea:  Penicillin      Kao-Pectate  Zithromax      Imodium AD         PUSH FLUIDS AVOID:       Cipro     Fever:  Tetracycline      Tylenol (Regular or Extra  Minocycline       Strength)  Levaquin      Extra Strength-Do not          Exceed 8 tabs/24 hrs Caffeine:        <264m/day (equiv. To 1 cup of coffee or  approx. 3 12 oz sodas)         Gas: Cold/Hayfever:       Gas-X  Benadryl      Mylicon  Claritin       Phazyme  **Claritin-D        Chlor-Trimeton    Headaches:  Dimetapp      ASA-Free Excedrin  Drixoral-Non-Drowsy     Cold Compress  Mucinex (Guaifenasin)     Tylenol (Regular or Extra  Sudafed/Sudafed-12 Hour     Strength)  **Sudafed PE Pseudoephedrine   Tylenol Cold & Sinus     Vicks Vapor Rub  Zyrtec  **AVOID if Problems With Blood Pressure         Heartburn: Avoid lying down for at least 1 hour after meals  Aciphex      Maalox     Rash:  Milk of Magnesia     Benadryl    Mylanta       1% Hydrocortisone Cream  Pepcid  Pepcid Complete   Sleep Aids:  Prevacid      Ambien   Prilosec       Benadryl  Rolaids       Chamomile Tea  Tums (Limit 4/day)     Unisom  Zantac       Tylenol PM         Warm milk-add vanilla or  Hemorrhoids:       Sugar for taste  Anusol/Anusol H.C.  (RX: Analapram 2.5%)  Sugar Substitutes:  Hydrocortisone OTC     Ok in moderation  Preparation H      Tucks        Vaseline lotion applied to tissue with wiping    Herpes:      Throat:  Acyclovir      Oragel  Famvir  Valtrex     Vaccines:         Flu Shot Leg Cramps:       *Gardasil  Benadryl      Hepatitis A         Hepatitis B Nasal Spray:  Pneumovax  Saline Nasal Spray     Polio Booster         Tetanus Nausea:       Tuberculosis test or PPD  Vitamin B6 25 mg TID   AVOID:    Dramamine      *Gardasil  Emetrol       Live Poliovirus  Ginger Root 250 mg QID    MMR (measles, mumps &  High Complex Carbs @ Bedtime    rebella)  Sea Bands-Accupressure    Varicella (Chickenpox)  Unisom 1/2 tab TID     *No known complications           If received before Pain:         Known pregnancy;   Darvocet       Resume series after  Lortab        Delivery  Percocet    Yeast:   Tramadol      Femstat  Tylenol 3      Gyne-lotrimin  Ultram       Monistat  Vicodin           MISC:         All Sunscreens           Hair Coloring/highlights          Insect Repellant's          (Including DEET)         Mystic Tans Third Trimester of Pregnancy  The third trimester is from week 28 through week 40 (months 7 through 9). This trimester is when your unborn baby (fetus) is growing very fast. At the end of the ninth month, the unborn baby is about 20 inches in length. It weighs about 6-10 pounds. Follow these instructions at home: Medicines  Take over-the-counter and prescription medicines only as told by your doctor. Some medicines are safe and some medicines are not safe during pregnancy.  Take a prenatal vitamin that contains at least 600 micrograms (mcg) of folic acid.  If you have trouble pooping (constipation), take medicine that will make your stool soft (stool softener) if your doctor approves. Eating and drinking   Eat regular, healthy meals.  Avoid raw meat and uncooked cheese.  If you get low calcium from the food you eat, talk to your doctor about taking a daily calcium supplement.  Eat four or five small meals rather than three large meals a day.  Avoid  foods that are high in fat and sugars, such as fried and sweet foods.  To prevent constipation: ? Eat foods that are high in fiber, like fresh fruits and vegetables, whole grains, and beans. ? Drink enough fluids to keep your pee (urine) clear or pale yellow. Activity  Exercise only as told by your doctor. Stop exercising if you start to have cramps.  Avoid heavy lifting, wear low heels, and sit up straight.  Do not exercise if it is too hot, too humid, or if you are in a place of great height (high altitude).  You may continue to have sex unless your doctor tells you not to. Relieving pain and discomfort  Wear a good support bra if your breasts are tender.  Take frequent breaks and rest with your legs raised if you have leg cramps or low back pain.  Take warm water baths (sitz baths) to soothe pain or discomfort caused by hemorrhoids. Use hemorrhoid cream if your doctor approves.  If you develop puffy, bulging veins (varicose veins) in  your legs: ? Wear support hose or compression stockings as told by your doctor. ? Raise (elevate) your feet for 15 minutes, 3-4 times a day. ? Limit salt in your food. Safety  Wear your seat belt when driving.  Make a list of emergency phone numbers, including numbers for family, friends, the hospital, and police and fire departments. Preparing for your baby's arrival To prepare for the arrival of your baby:  Take prenatal classes.  Practice driving to the hospital.  Visit the hospital and tour the maternity area.  Talk to your work about taking leave once the baby comes.  Pack your hospital bag.  Prepare the baby's room.  Go to your doctor visits.  Buy a rear-facing car seat. Learn how to install it in your car. General instructions  Do not use hot tubs, steam rooms, or saunas.  Do not use any products that contain nicotine or tobacco, such as cigarettes and e-cigarettes. If you need help quitting, ask your doctor.  Do not drink  alcohol.  Do not douche or use tampons or scented sanitary pads.  Do not cross your legs for long periods of time.  Do not travel for long distances unless you must. Only do so if your doctor says it is okay.  Visit your dentist if you have not gone during your pregnancy. Use a soft toothbrush to brush your teeth. Be gentle when you floss.  Avoid cat litter boxes and soil used by cats. These carry germs that can cause birth defects in the baby and can cause a loss of your baby (miscarriage) or stillbirth.  Keep all your prenatal visits as told by your doctor. This is important. Contact a doctor if:  You are not sure if you are in labor or if your water has broken.  You are dizzy.  You have mild cramps or pressure in your lower belly.  You have a nagging pain in your belly area.  You continue to feel sick to your stomach, you throw up, or you have watery poop.  You have bad smelling fluid coming from your vagina.  You have pain when you pee. Get help right away if:  You have a fever.  You are leaking fluid from your vagina.  You are spotting or bleeding from your vagina.  You have severe belly cramps or pain.  You lose or gain weight quickly.  You have trouble catching your breath and have chest pain.  You notice sudden or extreme puffiness (swelling) of your face, hands, ankles, feet, or legs.  You have not felt the baby move in over an hour.  You have severe headaches that do not go away with medicine.  You have trouble seeing.  You are leaking, or you are having a gush of fluid, from your vagina before you are 37 weeks.  You have regular belly spasms (contractions) before you are 37 weeks. Summary  The third trimester is from week 28 through week 40 (months 7 through 9). This time is when your unborn baby is growing very fast.  Follow your doctor's advice about medicine, food, and activity.  Get ready for the arrival of your baby by taking prenatal  classes, getting all the baby items ready, preparing the baby's room, and visiting your doctor to be checked.  Get help right away if you are bleeding from your vagina, or you have chest pain and trouble catching your breath, or if you have not felt your baby move in over an  hour. This information is not intended to replace advice given to you by your health care provider. Make sure you discuss any questions you have with your health care provider. Document Released: 04/05/2009 Document Revised: 05/02/2018 Document Reviewed: 02/15/2016 Elsevier Patient Education  2020 [employer].

## 2018-08-08 NOTE — Progress Notes (Signed)
ROB-No complaints.  

## 2018-08-08 NOTE — Progress Notes (Signed)
ROB-Doing well, no questions or concerns. 36 week cultures today, requests SVE. Pre-labor checklist and herbal prep handouts provided. Anticipatory guidance regarding course of prenatal care. Reviewed red flag symptoms and when to call. RTC x 1 week for ROB or sooner if needed.

## 2018-08-08 NOTE — Addendum Note (Signed)
Addended by: Edrick Oh J on: 08/08/2018 03:54 PM   Modules accepted: Orders

## 2018-08-10 LAB — STREP GP B NAA: Strep Gp B NAA: POSITIVE — AB

## 2018-08-12 ENCOUNTER — Encounter: Payer: Self-pay | Admitting: Certified Nurse Midwife

## 2018-08-12 DIAGNOSIS — B951 Streptococcus, group B, as the cause of diseases classified elsewhere: Secondary | ICD-10-CM | POA: Insufficient documentation

## 2018-08-13 ENCOUNTER — Telehealth: Payer: Self-pay

## 2018-08-13 LAB — GC/CHLAMYDIA PROBE AMP
Chlamydia trachomatis, NAA: NEGATIVE
Neisseria Gonorrhoeae by PCR: NEGATIVE

## 2018-08-13 NOTE — Telephone Encounter (Signed)
Coronavirus (COVID-19) Are you at risk?  Are you at risk for the Coronavirus (COVID-19)?  To be considered HIGH RISK for Coronavirus (COVID-19), you have to meet the following criteria:  . Traveled to [place], [place], [place], [place] or [place]; or in the [place] to [place], [place], [place], or [place]; and have fever, cough, and shortness of breath within the last 2 weeks of travel OR . Been in close contact with a person diagnosed with COVID-19 within the last 2 weeks and have fever, cough, and shortness of breath . IF YOU DO NOT MEET THESE CRITERIA, YOU ARE CONSIDERED LOW RISK FOR COVID-19.  What to do if you are HIGH RISK for COVID-19?  . If you are having a medical emergency, call 911. . Seek medical care right away. Before you go to a doctor's office, urgent care or emergency department, call ahead and tell them about your recent travel, contact with someone diagnosed with COVID-19, and your symptoms. You should receive instructions from your physician's office regarding next steps of care.  . When you arrive at healthcare provider, tell the healthcare staff immediately you have returned from visiting [place], [place], [place], [place] or [place]; or traveled in the [place] to [place], [place], [place], or [place]; in the last two weeks or you have been in close contact with a person diagnosed with COVID-19 in the last 2 weeks.   . Tell the health care staff about your symptoms: fever, cough and shortness of breath. . After you have been seen by a medical provider, you will be either: o Tested for (COVID-19) and discharged home on quarantine except to seek medical care if symptoms worsen, and asked to  - Stay home and avoid contact with others until you get your results (4-5 days)  - Avoid travel on public transportation if possible (such as bus, train, or airplane) or o Sent to the Emergency Department by EMS for evaluation, COVID-19 testing, and possible  admission depending on your condition and test results.  What to do if you are LOW RISK for COVID-19?  Reduce your risk of any infection by using the same precautions used for avoiding the common cold or flu:  . Wash your hands often with soap and warm water for at least 20 seconds.  If soap and water are not readily available, use an alcohol-based hand sanitizer with at least 60% alcohol.  . If coughing or sneezing, cover your mouth and nose by coughing or sneezing into the elbow areas of your shirt or coat, into a tissue or into your sleeve (not your hands). . Avoid shaking hands with others and consider head nods or verbal greetings only. . Avoid touching your eyes, nose, or mouth with unwashed hands.  . Avoid close contact with people who are Keontay Vora. . Avoid places or events with large numbers of people in one location, like concerts or sporting events. . Carefully consider travel plans you have or are making. . If you are planning any travel outside or inside the [place], visit the CDC's Travelers' Health webpage for the latest health notices. . If you have some symptoms but not all symptoms, continue to monitor at home and seek medical attention if your symptoms worsen. . If you are having a medical emergency, call 911.  08/13/18 SCREENING NEG SLS ADDITIONAL HEALTHCARE OPTIONS FOR PATIENTS  [facility] / e-Visit: [email or web address]         [facility]: [phone]    [facility]: [phone]                   [facility]: [phone]  

## 2018-08-14 ENCOUNTER — Encounter: Payer: Medicaid Other | Admitting: Certified Nurse Midwife

## 2018-08-21 ENCOUNTER — Encounter: Payer: Self-pay | Admitting: Certified Nurse Midwife

## 2018-08-21 ENCOUNTER — Other Ambulatory Visit: Payer: Self-pay

## 2018-08-21 ENCOUNTER — Ambulatory Visit (INDEPENDENT_AMBULATORY_CARE_PROVIDER_SITE_OTHER): Payer: Medicaid Other | Admitting: Certified Nurse Midwife

## 2018-08-21 VITALS — BP 101/60 | HR 102 | Wt 122.2 lb

## 2018-08-21 DIAGNOSIS — Z3493 Encounter for supervision of normal pregnancy, unspecified, third trimester: Secondary | ICD-10-CM

## 2018-08-21 LAB — POCT URINALYSIS DIPSTICK OB
Bilirubin, UA: NEGATIVE
Blood, UA: NEGATIVE
Glucose, UA: NEGATIVE
Ketones, UA: NEGATIVE
Leukocytes, UA: NEGATIVE
Nitrite, UA: NEGATIVE
POC,PROTEIN,UA: NEGATIVE
Spec Grav, UA: 1.01 (ref 1.010–1.025)
Urobilinogen, UA: 0.2 E.U./dL
pH, UA: 6 (ref 5.0–8.0)

## 2018-08-21 NOTE — Progress Notes (Signed)
ROB doing well. Feels good movement. Labor precautions reviewed. Follow up 1 wk  Shelby Kelly, CNM  

## 2018-08-21 NOTE — Patient Instructions (Signed)

## 2018-08-21 NOTE — Progress Notes (Signed)
ROB-Patient c/o constant pain around rib area and lower back pain "for a while".

## 2018-08-25 ENCOUNTER — Inpatient Hospital Stay: Payer: Medicaid Other | Admitting: Anesthesiology

## 2018-08-25 ENCOUNTER — Inpatient Hospital Stay
Admission: EM | Admit: 2018-08-25 | Discharge: 2018-08-27 | DRG: 807 | Disposition: A | Discharge status: Home / Self Care | Payer: Medicaid Other | Attending: Obstetrics and Gynecology | Admitting: Obstetrics and Gynecology

## 2018-08-25 ENCOUNTER — Other Ambulatory Visit: Payer: Self-pay

## 2018-08-25 DIAGNOSIS — Z3A39 39 weeks gestation of pregnancy: Secondary | ICD-10-CM

## 2018-08-25 DIAGNOSIS — Z87891 Personal history of nicotine dependence: Secondary | ICD-10-CM

## 2018-08-25 DIAGNOSIS — Z20828 Contact with and (suspected) exposure to other viral communicable diseases: Secondary | ICD-10-CM | POA: Diagnosis present

## 2018-08-25 DIAGNOSIS — O99820 Streptococcus B carrier state complicating pregnancy: Secondary | ICD-10-CM | POA: Diagnosis not present

## 2018-08-25 DIAGNOSIS — O26893 Other specified pregnancy related conditions, third trimester: Secondary | ICD-10-CM | POA: Diagnosis present

## 2018-08-25 DIAGNOSIS — Z8759 Personal history of other complications of pregnancy, childbirth and the puerperium: Secondary | ICD-10-CM

## 2018-08-25 DIAGNOSIS — O09899 Supervision of other high risk pregnancies, unspecified trimester: Secondary | ICD-10-CM

## 2018-08-25 DIAGNOSIS — O99824 Streptococcus B carrier state complicating childbirth: Secondary | ICD-10-CM | POA: Diagnosis present

## 2018-08-25 DIAGNOSIS — B951 Streptococcus, group B, as the cause of diseases classified elsewhere: Secondary | ICD-10-CM

## 2018-08-25 LAB — CBC WITH DIFFERENTIAL/PLATELET
Abs Immature Granulocytes: 0.06 10*3/uL (ref 0.00–0.07)
Basophils Absolute: 0.1 10*3/uL (ref 0.0–0.1)
Basophils Relative: 1 %
Eosinophils Absolute: 0 10*3/uL (ref 0.0–0.5)
Eosinophils Relative: 0 %
HCT: 35.3 % — ABNORMAL LOW (ref 36.0–46.0)
Hemoglobin: 12.2 g/dL (ref 12.0–15.0)
Immature Granulocytes: 1 %
Lymphocytes Relative: 14 %
Lymphs Abs: 1.7 10*3/uL (ref 0.7–4.0)
MCH: 27.5 pg (ref 26.0–34.0)
MCHC: 34.6 g/dL (ref 30.0–36.0)
MCV: 79.5 fL — ABNORMAL LOW (ref 80.0–100.0)
Monocytes Absolute: 0.7 10*3/uL (ref 0.1–1.0)
Monocytes Relative: 6 %
Neutro Abs: 10 10*3/uL — ABNORMAL HIGH (ref 1.7–7.7)
Neutrophils Relative %: 78 %
Platelets: 244 10*3/uL (ref 150–400)
RBC: 4.44 MIL/uL (ref 3.87–5.11)
RDW: 13.2 % (ref 11.5–15.5)
WBC: 12.6 10*3/uL — ABNORMAL HIGH (ref 4.0–10.5)
nRBC: 0 % (ref 0.0–0.2)

## 2018-08-25 LAB — TYPE AND SCREEN
ABO/RH(D): A POS
Antibody Screen: NEGATIVE

## 2018-08-25 LAB — URINE DRUG SCREEN, QUALITATIVE (ARMC ONLY)
Amphetamines, Ur Screen: NOT DETECTED
Barbiturates, Ur Screen: NOT DETECTED
Benzodiazepine, Ur Scrn: NOT DETECTED
Cannabinoid 50 Ng, Ur ~~LOC~~: NOT DETECTED
Cocaine Metabolite,Ur ~~LOC~~: NOT DETECTED
MDMA (Ecstasy)Ur Screen: NOT DETECTED
Methadone Scn, Ur: NOT DETECTED
Opiate, Ur Screen: NOT DETECTED
Phencyclidine (PCP) Ur S: NOT DETECTED
Tricyclic, Ur Screen: NOT DETECTED

## 2018-08-25 LAB — SARS CORONAVIRUS 2 BY RT PCR (HOSPITAL ORDER, PERFORMED IN ~~LOC~~ HOSPITAL LAB): SARS Coronavirus 2: NEGATIVE

## 2018-08-25 IMAGING — CR DG CHEST 2V
2 series · 2 of 2 positions shown · non-contrast
Comparison: Chest x-ray dated October 08, 2016.

CLINICAL DATA: Chest tightness since yesterday.

EXAM:
CHEST  2 VIEW

[chest pa]
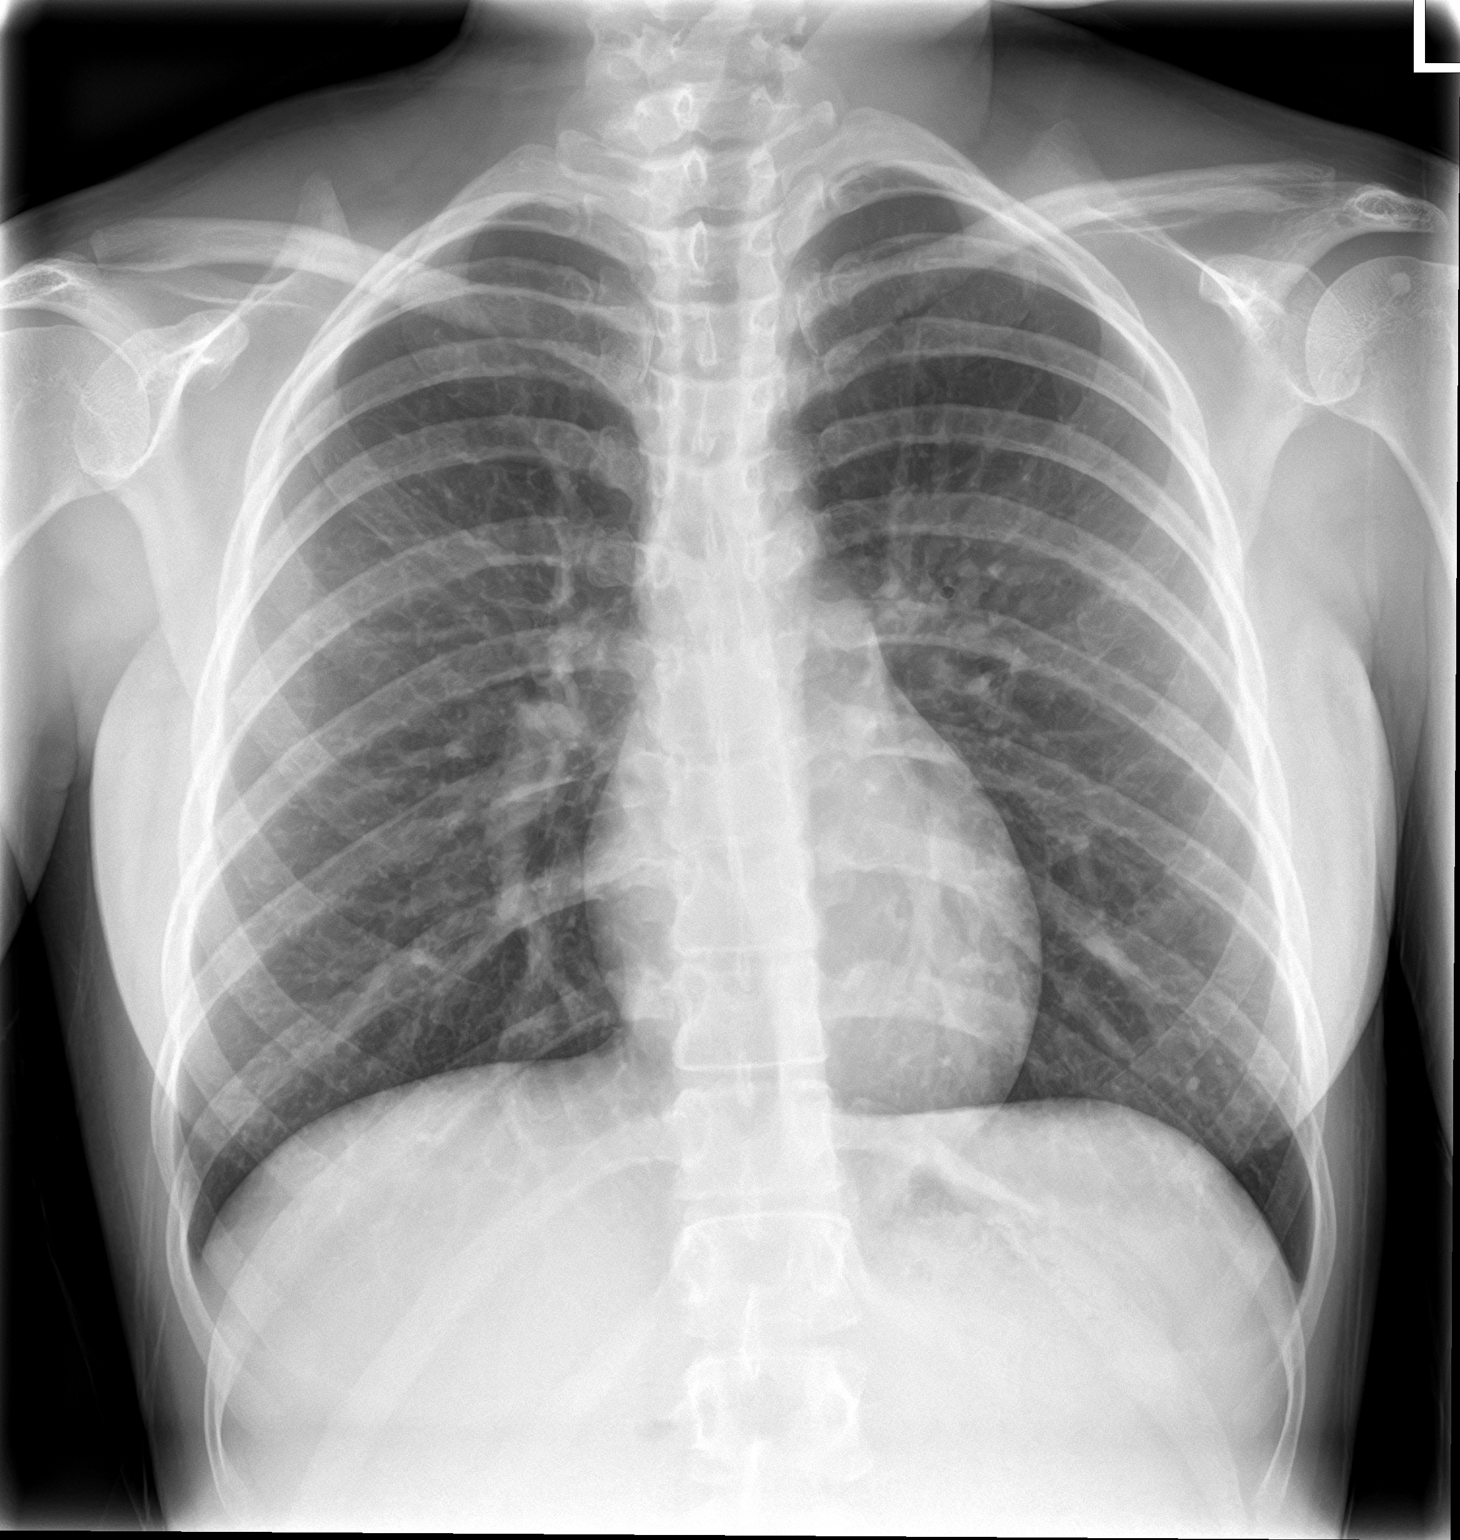

[chest lat]
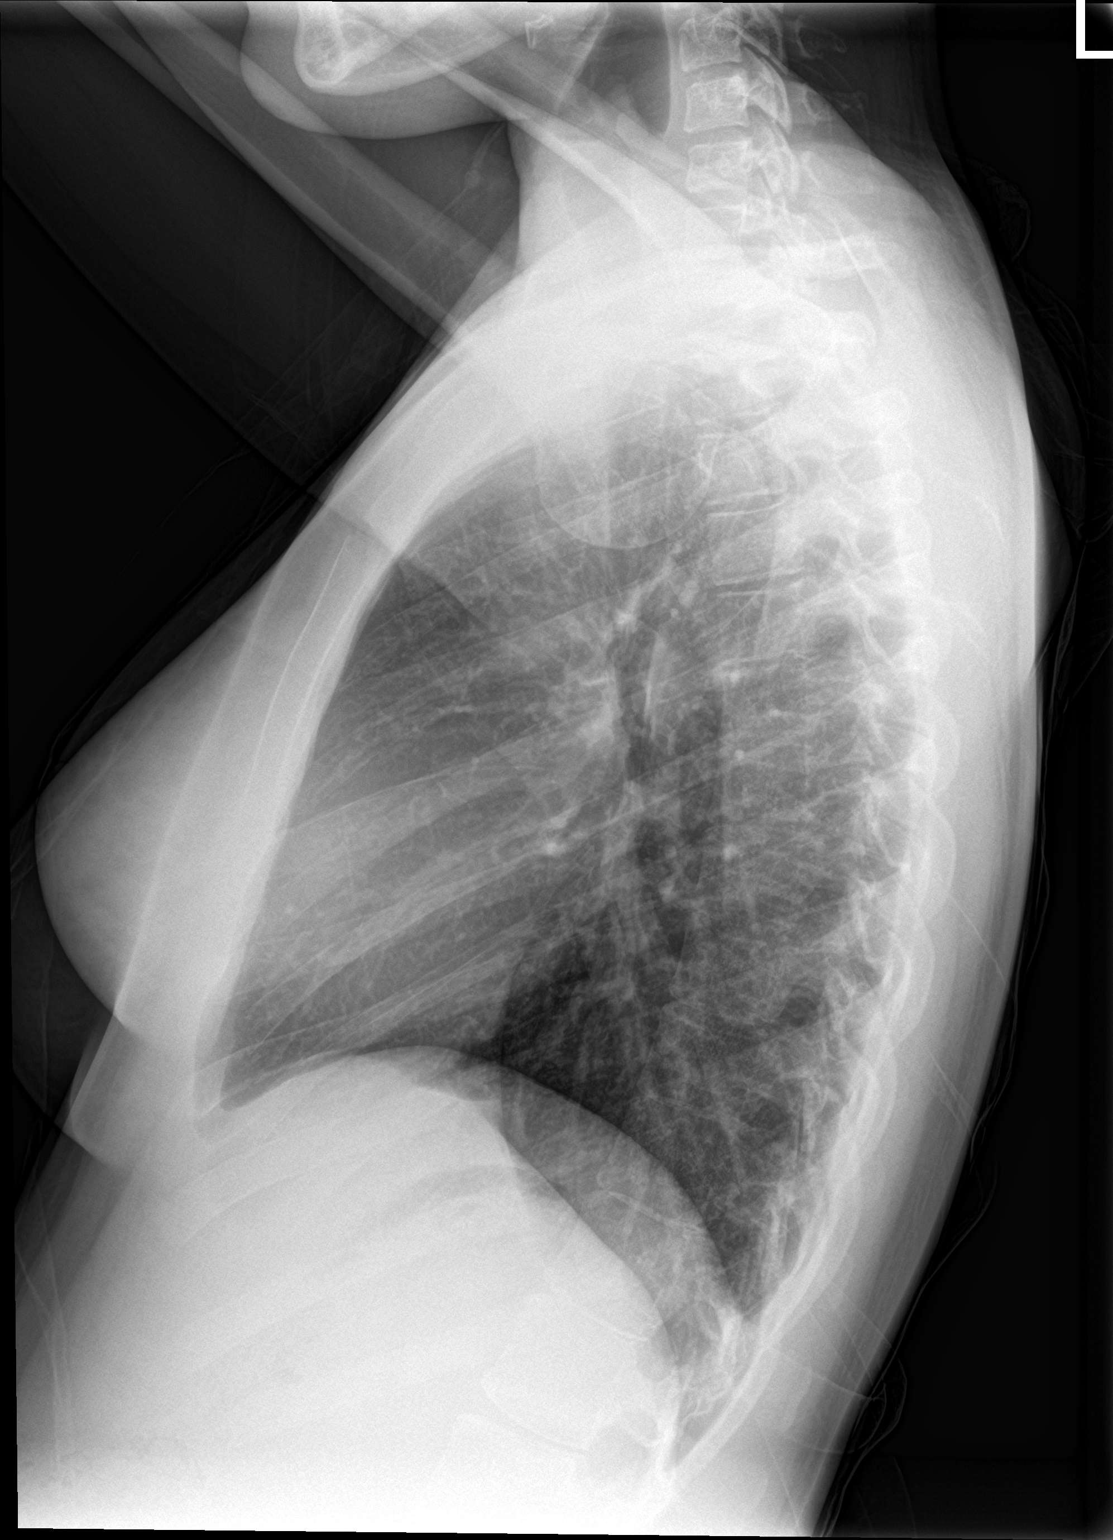

[2 of 2 positions shown; findings below may reference images not displayed]

FINDINGS: The heart size and mediastinal contours are within normal limits.
Both lungs are clear. The visualized skeletal structures are
unremarkable.
IMPRESSION: No active cardiopulmonary disease.

## 2018-08-25 MED ORDER — DIPHENHYDRAMINE HCL 50 MG/ML IJ SOLN: 12.5000 mg | INTRAMUSCULAR | Status: DC | PRN

## 2018-08-25 MED ORDER — LIDOCAINE HCL (PF) 1 % IJ SOLN: 30.0000 mL | INTRAMUSCULAR | Status: DC | PRN

## 2018-08-25 MED ORDER — FENTANYL 2.5 MCG/ML W/ROPIVACAINE 0.15% IN NS 100 ML EPIDURAL (ARMC)
EPIDURAL | Status: AC
  Filled 2018-08-25: qty 100

## 2018-08-25 MED ORDER — LACTATED RINGERS IV SOLN: 500.0000 mL | Freq: Once | INTRAVENOUS | Status: DC

## 2018-08-25 MED ORDER — SOD CITRATE-CITRIC ACID 500-334 MG/5ML PO SOLN: 30.0000 mL | ORAL | Status: DC | PRN

## 2018-08-25 MED ORDER — SODIUM CHLORIDE 0.9 % IV SOLN
2.0000 g | Freq: Once | INTRAVENOUS | Status: AC
  Administered 2018-08-25: 18:00:00 2 g via INTRAVENOUS

## 2018-08-25 MED ORDER — LIDOCAINE HCL (PF) 1 % IJ SOLN
INTRAMUSCULAR | Status: DC | PRN
  Administered 2018-08-25: 3 mL via SUBCUTANEOUS

## 2018-08-25 MED ORDER — FENTANYL 2.5 MCG/ML W/ROPIVACAINE 0.15% IN NS 100 ML EPIDURAL (ARMC): 12.0000 mL/h | EPIDURAL | Status: DC

## 2018-08-25 MED ORDER — TERBUTALINE SULFATE 1 MG/ML IJ SOLN: 0.2500 mg | Freq: Once | INTRAMUSCULAR | Status: DC | PRN

## 2018-08-25 MED ORDER — LIDOCAINE-EPINEPHRINE (PF) 1.5 %-1:200000 IJ SOLN
INTRAMUSCULAR | Status: DC | PRN
  Administered 2018-08-25: 3 mL via EPIDURAL

## 2018-08-25 MED ORDER — PHENYLEPHRINE 40 MCG/ML (10ML) SYRINGE FOR IV PUSH (FOR BLOOD PRESSURE SUPPORT): 80.0000 ug | PREFILLED_SYRINGE | INTRAVENOUS | Status: DC | PRN

## 2018-08-25 MED ORDER — SODIUM CHLORIDE 0.9 % IV SOLN
INTRAVENOUS | Status: AC
  Filled 2018-08-25: qty 2000

## 2018-08-25 MED ORDER — LIDOCAINE HCL (PF) 1 % IJ SOLN
INTRAMUSCULAR | Status: AC
  Filled 2018-08-25: qty 30

## 2018-08-25 MED ORDER — LACTATED RINGERS IV SOLN: 500.0000 mL | INTRAVENOUS | Status: DC | PRN

## 2018-08-25 MED ORDER — SODIUM CHLORIDE 0.9 % IV SOLN
INTRAVENOUS | Status: DC | PRN
  Administered 2018-08-25 (×2): 5 mL via EPIDURAL

## 2018-08-25 MED ORDER — LACTATED RINGERS IV SOLN
INTRAVENOUS | Status: DC
  Administered 2018-08-25 (×2): via INTRAVENOUS

## 2018-08-25 MED ORDER — PENICILLIN G POTASSIUM 5000000 UNITS IJ SOLR
5.0000 10*6.[IU] | INTRAMUSCULAR | Status: DC
  Filled 2018-08-25 (×2): qty 5

## 2018-08-25 MED ORDER — EPHEDRINE 5 MG/ML INJ: 10.0000 mg | INTRAVENOUS | Status: DC | PRN

## 2018-08-25 MED ORDER — OXYTOCIN 40 UNITS IN NORMAL SALINE INFUSION - SIMPLE MED
1.0000 m[IU]/min | INTRAVENOUS | Status: DC
  Administered 2018-08-25: 23:00:00 2 m[IU]/min via INTRAVENOUS

## 2018-08-25 MED ORDER — MISOPROSTOL 200 MCG PO TABS
ORAL_TABLET | ORAL | Status: AC
  Filled 2018-08-25: qty 4

## 2018-08-25 MED ORDER — OXYTOCIN 40 UNITS IN NORMAL SALINE INFUSION - SIMPLE MED
2.5000 [IU]/h | INTRAVENOUS | Status: DC
  Filled 2018-08-25: qty 1000

## 2018-08-25 MED ORDER — OXYTOCIN 10 UNIT/ML IJ SOLN
INTRAMUSCULAR | Status: AC
  Filled 2018-08-25: qty 2

## 2018-08-25 MED ORDER — AMMONIA AROMATIC IN INHA
RESPIRATORY_TRACT | Status: AC
  Filled 2018-08-25: qty 10

## 2018-08-25 MED ORDER — ONDANSETRON HCL 4 MG/2ML IJ SOLN: 4.0000 mg | Freq: Four times a day (QID) | INTRAMUSCULAR | Status: DC | PRN

## 2018-08-25 MED ORDER — SODIUM CHLORIDE 0.9 % IV SOLN
1.0000 g | INTRAVENOUS | Status: DC
  Administered 2018-08-25: 22:00:00 1 g via INTRAVENOUS
  Filled 2018-08-25: qty 1000

## 2018-08-25 MED ORDER — ACETAMINOPHEN 325 MG PO TABS: 650.0000 mg | ORAL_TABLET | ORAL | Status: DC | PRN

## 2018-08-25 MED ORDER — OXYTOCIN BOLUS FROM INFUSION
500.0000 mL | Freq: Once | INTRAVENOUS | Status: AC
  Administered 2018-08-26: 01:00:00 500 mL via INTRAVENOUS

## 2018-08-25 MED ORDER — LACTATED RINGERS IV SOLN: INTRAVENOUS | Status: DC

## 2018-08-25 MED ORDER — OXYTOCIN 40 UNITS IN NORMAL SALINE INFUSION - SIMPLE MED
INTRAVENOUS | Status: AC
  Administered 2018-08-25: 2 m[IU]/min via INTRAVENOUS
  Filled 2018-08-25: qty 1000

## 2018-08-25 MED ORDER — FENTANYL 2.5 MCG/ML W/ROPIVACAINE 0.15% IN NS 100 ML EPIDURAL (ARMC)
EPIDURAL | Status: DC | PRN
  Administered 2018-08-25: 12 mL/h via EPIDURAL

## 2018-08-25 MED ORDER — SODIUM CHLORIDE 0.9 % IV SOLN
INTRAVENOUS | Status: AC
  Filled 2018-08-25: qty 5

## 2018-08-25 NOTE — Progress Notes (Signed)
Shelby Kelly is a 23 y.o. G2P1001 at [redacted]w[redacted]d by LMP admitted for active labor  Subjective: Denies pain with contractions, resting well  Objective: BP 115/76 (BP Location: Right Arm)   Pulse (!) 109   Temp 98 F (36.7 C) (Oral)   Resp 16   Ht 4\' 11"  (1.499 m)   Wt 55.3 kg   LMP 11/13/2017 (Approximate)   SpO2 98%   BMI 24.64 kg/m  I/O last 3 completed shifts: In: 24 [I.V.:24] Out: -  Total I/O In: 923.1 [I.V.:823.1; IV Piggyback:100] Out: -   FHT:  FHR: 140 bpm, variability: moderate,  accelerations:  Present,  decelerations:  Absent UC:   irregular, every 3-5 minutes SVE:   Dilation: 8.5 Effacement (%): 90 Station: 0 Exam by:: Felisa Bonier CNM  Labs: Lab Results  Component Value Date   WBC 12.6 (H) 08/25/2018   HGB 12.2 08/25/2018   HCT 35.3 (L) 08/25/2018   MCV 79.5 (L) 08/25/2018   PLT 244 08/25/2018    Assessment / Plan: Protracted active phase  Labor: will start pitocin augmentation Preeclampsia:  labs stable Fetal Wellbeing:  Category I Pain Control:  Epidural I/D:  n/a Anticipated MOD:  NSVD  Shelby Kelly 08/25/2018, 11:27 PM
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
## 2018-08-25 NOTE — Progress Notes (Signed)
Shelby Kelly is a 23 y.o. G2P1001 at [redacted]w[redacted]d by LMP admitted for active labor  Subjective: Denies pain with contractions since epidural placed.  Objective: BP 117/77 (BP Location: Right Arm)   Pulse (!) 105   Temp 98 F (36.7 C) (Oral)   Resp 16   Ht 4\' 11"  (1.499 m)   Wt 55.3 kg   LMP 11/13/2017 (Approximate)   SpO2 100%   BMI 24.64 kg/m  I/O last 3 completed shifts: In: 24 [I.V.:24] Out: -  No intake/output data recorded.  FHT:  FHR: 144 bpm, variability: moderate,  accelerations:  Present,  decelerations:  Absent UC:   regular, every 3-4 minutes SVE:   Dilation: 8 Effacement (%): 90 Station: 0 Exam by:: Felisa Bonier CNM  AROM with moderate amount of clear fluid noted  Labs: Lab Results  Component Value Date   WBC 12.6 (H) 08/25/2018   HGB 12.2 08/25/2018   HCT 35.3 (L) 08/25/2018   MCV 79.5 (L) 08/25/2018   PLT 244 08/25/2018    Assessment / Plan: Spontaneous labor, progressing normally  Labor: Progressing normally Preeclampsia:  labs stable Fetal Wellbeing:  Category I Pain Control:  Epidural I/D:  n/a Anticipated MOD:  NSVD  Melody N Shambley 08/25/2018, 9:21 PM
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
## 2018-08-25 NOTE — Anesthesia Procedure Notes (Signed)
Epidural Patient location during procedure: OB Start time: 08/25/2018 8:44 PM End time: 08/25/2018 8:50 PM  Staffing Anesthesiologist: Martha Clan, MD Performed: anesthesiologist   Preanesthetic Checklist Completed: patient identified, site marked, surgical consent, pre-op evaluation, timeout performed, IV checked, risks and benefits discussed and monitors and equipment checked  Epidural Patient position: sitting Prep: ChloraPrep Patient monitoring: heart rate, continuous pulse ox and blood pressure Approach: midline Location: L3-L4 Injection technique: LOR saline  Needle:  Needle type: Tuohy  Needle gauge: 17 G Needle length: 9 cm and 9 Needle insertion depth: 4 cm Catheter type: closed end flexible Catheter size: 19 Gauge Catheter at skin depth: 8 cm Test dose: negative and 1.5% lidocaine with Epi 1:200 K  Assessment Sensory level: T10 Events: blood not aspirated, injection not painful, no injection resistance, negative IV test and no paresthesia  Additional Notes 1st attempt Pt. Evaluated and documentation done after procedure finished. Patient identified. Risks/Benefits/Options discussed with patient including but not limited to bleeding, infection, nerve damage, paralysis, failed block, incomplete pain control, headache, blood pressure changes, nausea, vomiting, reactions to medication both or allergic, itching and postpartum back pain. Confirmed with bedside nurse the patient's most recent platelet count. Confirmed with patient that they are not currently taking any anticoagulation, have any bleeding history or any family history of bleeding disorders. Patient expressed understanding and wished to proceed. All questions were answered. Sterile technique was used throughout the entire procedure. Please see nursing notes for vital signs. Test dose was given through epidural catheter and negative prior to continuing to dose epidural or start infusion. Warning signs of high block  given to the patient including shortness of breath, tingling/numbness in hands, complete motor block, or any concerning symptoms with instructions to call for help. Patient was given instructions on fall risk and not to get out of bed. All questions and concerns addressed with instructions to call with any issues or inadequate analgesia.   Patient tolerated the insertion well without immediate complications.Reason for block:procedure for pain

## 2018-08-25 NOTE — OB Triage Note (Signed)
Pt is a 23y.o G2P1 at [redacted]w[redacted]d that presents from ED with c/o ctx since around 1500 today and were around 3 minutes apart. Pt states now ctx have spaced out. Pt denies LOF, VB and states positive fetal Movement.

## 2018-08-26 DIAGNOSIS — O99824 Streptococcus B carrier state complicating childbirth: Secondary | ICD-10-CM

## 2018-08-26 LAB — CBC
HCT: 33.3 % — ABNORMAL LOW (ref 36.0–46.0)
Hemoglobin: 11.5 g/dL — ABNORMAL LOW (ref 12.0–15.0)
MCH: 27.4 pg (ref 26.0–34.0)
MCHC: 34.5 g/dL (ref 30.0–36.0)
MCV: 79.5 fL — ABNORMAL LOW (ref 80.0–100.0)
Platelets: 191 10*3/uL (ref 150–400)
RBC: 4.19 MIL/uL (ref 3.87–5.11)
RDW: 13.1 % (ref 11.5–15.5)
WBC: 14.3 10*3/uL — ABNORMAL HIGH (ref 4.0–10.5)
nRBC: 0 % (ref 0.0–0.2)

## 2018-08-26 MED ORDER — DOCUSATE SODIUM 100 MG PO CAPS
100.0000 mg | ORAL_CAPSULE | Freq: Two times a day (BID) | ORAL | Status: DC
  Administered 2018-08-26 – 2018-08-27 (×2): 100 mg via ORAL
  Filled 2018-08-26 (×2): qty 1

## 2018-08-26 MED ORDER — BENZOCAINE-MENTHOL 20-0.5 % EX AERO: 1.0000 "application " | INHALATION_SPRAY | CUTANEOUS | Status: DC | PRN

## 2018-08-26 MED ORDER — SIMETHICONE 80 MG PO CHEW: 80.0000 mg | CHEWABLE_TABLET | ORAL | Status: DC | PRN

## 2018-08-26 MED ORDER — DIBUCAINE (PERIANAL) 1 % EX OINT: 1.0000 "application " | TOPICAL_OINTMENT | CUTANEOUS | Status: DC | PRN

## 2018-08-26 MED ORDER — IBUPROFEN 600 MG PO TABS
600.0000 mg | ORAL_TABLET | Freq: Four times a day (QID) | ORAL | Status: DC
  Administered 2018-08-26 – 2018-08-27 (×5): 600 mg via ORAL
  Filled 2018-08-26 (×5): qty 1

## 2018-08-26 MED ORDER — ACETAMINOPHEN 325 MG PO TABS
650.0000 mg | ORAL_TABLET | ORAL | Status: DC | PRN
  Administered 2018-08-26: 650 mg via ORAL
  Filled 2018-08-26 (×2): qty 2

## 2018-08-26 MED ORDER — DIPHENHYDRAMINE HCL 25 MG PO CAPS: 25.0000 mg | ORAL_CAPSULE | Freq: Four times a day (QID) | ORAL | Status: DC | PRN

## 2018-08-26 MED ORDER — ONDANSETRON HCL 4 MG/2ML IJ SOLN: 4.0000 mg | INTRAMUSCULAR | Status: DC | PRN

## 2018-08-26 MED ORDER — WITCH HAZEL-GLYCERIN EX PADS: 1.0000 "application " | MEDICATED_PAD | CUTANEOUS | Status: DC | PRN

## 2018-08-26 MED ORDER — PRENATAL MULTIVITAMIN CH
1.0000 | ORAL_TABLET | Freq: Every day | ORAL | Status: DC
  Administered 2018-08-26: 1 via ORAL
  Filled 2018-08-26: qty 1

## 2018-08-26 MED ORDER — COCONUT OIL OIL: 1.0000 "application " | TOPICAL_OIL | Status: DC | PRN

## 2018-08-26 MED ORDER — ONDANSETRON HCL 4 MG PO TABS: 4.0000 mg | ORAL_TABLET | ORAL | Status: DC | PRN

## 2018-08-26 MED ORDER — ZOLPIDEM TARTRATE 5 MG PO TABS: 5.0000 mg | ORAL_TABLET | Freq: Every evening | ORAL | Status: DC | PRN

## 2018-08-26 NOTE — Progress Notes (Signed)
Patient ID: Shelby Kelly, female   DOB: 17-Oct-1995, 23 y.o.   MRN: [id number]  Post Partum Day # 1/2, s/p spontaneous vaginal birth, Rh positive, GBS negative, Breastfeeding with formula supplementation  Subjective:  Patient sitting in bed, eating breakfast. No questions or concerns. Infant sleeping in bassinet at bedside.   Denies difficulty breathing or respiratory distress, chest pain, abdominal pain, excessive vaginal bleeding, dysuria, and leg pain or swelling.   Objective: Temp:  [98 F (36.7 C)-98.6 F (37 C)] 98.6 F (37 C) (08/03 0830) Pulse Rate:  [79-151] 79 (08/03 0830) Resp:  [16-18] 18 (08/03 0830) BP: (95-128)/(53-94) 103/71 (08/03 0830) SpO2:  [97 %-100 %] 98 % (08/03 0830) Weight:  [55.3 kg] 55.3 kg (08/02 1725)  Physical Exam:   General: alert and cooperative   Lungs: clear to auscultation bilaterally  Breasts: deferred, no complaints  Heart: normal apical impulse   Abdomen: soft, non-tender; bowel sounds normal; no masses,  no organomegaly  Pelvis: Lochia: appropriate, Uterine Fundus: firm  Extremities: DVT Evaluation: no evidence of DVT seen on physical exam.  Recent Labs    08/25/18 1800 08/26/18 0739  HGB 12.2 11.5*  HCT 35.3* 33.3*    Assessment:  23 year old G2P2, Post Partum Day # 1/2, s/p spontaneous vaginal birth, Rh positive, GBS negative  Breastfeeding with formula supplementation  Plan: Plan for discharge tomorrow   LOS: 1 day    Diona Fanti, CNM [facility], [facility] 08/26/2018 9:21 AM

## 2018-08-26 NOTE — Clinical Social Work Maternal (Signed)
CLINICAL SOCIAL WORK MATERNAL/CHILD NOTE  Patient Details  Name: Shelby Kelly MRN: [id number] Date of Birth: 07-Apr-1995  Date:  08/26/2018  Clinical Social Worker Initiating Note:  Mel Almond Leonides Minder, LCSW Date/Time: Initiated:  08/26/18/1100     Child's Name:  Mother has not named the baby girl yet.   Biological Parents:  Mother, Father   Need for Interpreter:  None   Reason for Referral:  Current Substance Use/Substance Use During Pregnancy    Address:  [address] [place] [address]    Phone number:  [phone] (home)     Additional phone number:   Household Members/Support Persons (HM/SP):   Household Member/Support Person 1   HM/SP Name Relationship DOB or Age  HM/SP -1 Gerline Legacy Significant other    HM/SP -2        HM/SP -3        HM/SP -4        HM/SP -5        HM/SP -6        HM/SP -7        HM/SP -8          Natural Supports (not living in the home):      Professional Supports: None   Employment: Unemployed   Type of Work: Mother was laid off due to [employer]   Education:  High school graduate   Homebound arranged:    Museum/gallery curator Resources:  Medicaid   Other Resources:  [facility]   Cultural/Religious Considerations Which May Impact Care:    Strengths:  Ability to meet basic needs    Psychotropic Medications:         Pediatrician:       Pediatrician List:   [place]      Pediatrician Fax Number:    Risk Factors/Current Problems:  Substance Use    Cognitive State:  Alert    Mood/Affect:  Happy , Calm    CSW Assessment: Clinical Education officer, museum (CSW) received consult that 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 Mother's most recent urine drug screen is negative. Per RN mother's urine drug screen was positive for marijuana in January 2020. Newborn's urine drug screen and cord tissue drug screen is pending. CSW met with mother and her  infant was at bedside. CSW introduced self and explained role of CSW department. Per mother she has not named her baby girl yet. Mother reported that she lives in [place] on the [place] with her boyfriend Gerline Legacy and their 41 month old. Per mother this is her 2nd baby. Mother reported that she used marijuana during her pregnancy and last used 3-4 months ago. Per mother she did not use marijuana while her 39 month old was under her supervision or in the same home. Per mother she was working but was laid off due to covid. Mother reported that she does not use any other drugs and does not drink alcohol. Mother reported that she has all the supplies needed for infant including car seat and crib. Mother reported that she was [facility] and Medicaid. Mother reported that she does not need counseling or therapy for mental health or substance abuse. CSW provided mother with list of [facility] resources including mental health clinics and substance abuse treatment. Mother reported no needs or concerns. CSW explained to mother that if infant's urine drug  screen or cord tissue screen is positive for marijuana or any other illicit drugs then a CPS report will have to be made. Mother verbalized her understanding.    CSW Plan/Description:  CSW Will Continue to Monitor Umbilical Cord Tissue Drug Screen Results and Make Report if [facility], Lenice Llamas 08/26/2018, 2:31 PM

## 2018-08-26 NOTE — Discharge Instructions (Signed)
Postpartum Care After Vaginal Delivery °This sheet gives you information about how to care for yourself from the time you deliver your baby to up to 6-12 weeks after delivery (postpartum period). Your health care provider may also give you more specific instructions. If you have problems or questions, contact your health care provider. °Follow these instructions at home: °Vaginal bleeding °· It is normal to have vaginal bleeding (lochia) after delivery. Wear a sanitary pad for vaginal bleeding and discharge. °? During the first week after delivery, the amount and appearance of lochia is often similar to a menstrual period. °? Over the next few weeks, it will gradually decrease to a dry, yellow-brown discharge. °? For most women, lochia stops completely by 4-6 weeks after delivery. Vaginal bleeding can vary from woman to woman. °· Change your sanitary pads frequently. Watch for any changes in your flow, such as: °? A sudden increase in volume. °? A change in color. °? Large blood clots. °· If you pass a blood clot from your vagina, save it and call your health care provider to discuss. Do not flush blood clots down the toilet before talking with your health care provider. °· Do not use tampons or douches until your health care provider says this is safe. °· If you are not breastfeeding, your period should return 6-8 weeks after delivery. If you are feeding your child breast milk only (exclusive breastfeeding), your period may not return until you stop breastfeeding. °Perineal care °· Keep the area between the vagina and the anus (perineum) clean and dry as told by your health care provider. Use medicated pads and pain-relieving sprays and creams as directed. °· If you had a cut in the perineum (episiotomy) or a tear in the vagina, check the area for signs of infection until you are healed. Check for: °? More redness, swelling, or pain. °? Fluid or blood coming from the cut or tear. °? Warmth. °? Pus or a bad  smell. °· You may be given a squirt bottle to use instead of wiping to clean the perineum area after you go to the bathroom. As you start healing, you may use the squirt bottle before wiping yourself. Make sure to wipe gently. °· To relieve pain caused by an episiotomy, a tear in the vagina, or swollen veins in the anus (hemorrhoids), try taking a warm sitz bath 2-3 times a day. A sitz bath is a warm water bath that is taken while you are sitting down. The water should only come up to your hips and should cover your buttocks. °Breast care °· Within the first few days after delivery, your breasts may feel heavy, full, and uncomfortable (breast engorgement). Milk may also leak from your breasts. Your health care provider can suggest ways to help relieve the discomfort. Breast engorgement should go away within a few days. °· If you are breastfeeding: °? Wear a bra that supports your breasts and fits you well. °? Keep your nipples clean and dry. Apply creams and ointments as told by your health care provider. °? You may need to use breast pads to absorb milk that leaks from your breasts. °? You may have uterine contractions every time you breastfeed for up to several weeks after delivery. Uterine contractions help your uterus return to its normal size. °? If you have any problems with breastfeeding, work with your health care provider or lactation consultant. °· If you are not breastfeeding: °? Avoid touching your breasts a lot. Doing this can make   your breasts produce more milk. °? Wear a good-fitting bra and use cold packs to help with swelling. °? Do not squeeze out (express) milk. This causes you to make more milk. °Intimacy and sexuality °· Ask your health care provider when you can engage in sexual activity. This may depend on: °? Your risk of infection. °? How fast you are healing. °? Your comfort and desire to engage in sexual activity. °· You are able to get pregnant after delivery, even if you have not had  your period. If desired, talk with your health care provider about methods of birth control (contraception). °Medicines °· Take over-the-counter and prescription medicines only as told by your health care provider. °· If you were prescribed an antibiotic medicine, take it as told by your health care provider. Do not stop taking the antibiotic even if you start to feel better. °Activity °· Gradually return to your normal activities as told by your health care provider. Ask your health care provider what activities are safe for you. °· Rest as much as possible. Try to rest or take a nap while your baby is sleeping. °Eating and drinking ° °· Drink enough fluid to keep your urine pale yellow. °· Eat high-fiber foods every day. These may help prevent or relieve constipation. High-fiber foods include: °? Whole grain cereals and breads. °? Brown rice. °? Beans. °? Fresh fruits and vegetables. °· Do not try to lose weight quickly by cutting back on calories. °· Take your prenatal vitamins until your postpartum checkup or until your health care provider tells you it is okay to stop. °Lifestyle °· Do not use any products that contain nicotine or tobacco, such as cigarettes and e-cigarettes. If you need help quitting, ask your health care provider. °· Do not drink alcohol, especially if you are breastfeeding. °General instructions °· Keep all follow-up visits for you and your baby as told by your health care provider. Most women visit their health care provider for a postpartum checkup within the first 3-6 weeks after delivery. °Contact a health care provider if: °· You feel unable to cope with the changes that your child brings to your life, and these feelings do not go away. °· You feel unusually sad or worried. °· Your breasts become red, painful, or hard. °· You have a fever. °· You have trouble holding urine or keeping urine from leaking. °· You have little or no interest in activities you used to enjoy. °· You have not  breastfed at all and you have not had a menstrual period for 12 weeks after delivery. °· You have stopped breastfeeding and you have not had a menstrual period for 12 weeks after you stopped breastfeeding. °· You have questions about caring for yourself or your baby. °· You pass a blood clot from your vagina. °Get help right away if: °· You have chest pain. °· You have difficulty breathing. °· You have sudden, severe leg pain. °· You have severe pain or cramping in your lower abdomen. °· You bleed from your vagina so much that you fill more than one sanitary pad in one hour. Bleeding should not be heavier than your heaviest period. °· You develop a severe headache. °· You faint. °· You have blurred vision or spots in your vision. °· You have bad-smelling vaginal discharge. °· You have thoughts about hurting yourself or your baby. °If you ever feel like you may hurt yourself or others, or have thoughts about taking your own life, get help   right away. You can go to the nearest emergency department or call:  Your local emergency services (911 in the [place]  A suicide crisis helpline, such as the National Suicide Prevention Lifeline at (551)106-13331-905-757-9716. This is open 24 hours a day. Summary  The period of time right after you deliver your newborn up to 6-12 weeks after delivery is called the postpartum period.  Gradually return to your normal activities as told by your health care provider.  Keep all follow-up visits for you and your baby as told by your health care provider. This information is not intended to replace advice given to you by your health care provider. Make sure you discuss any questions you have with your health care provider. Document Released: 11/06/2006 Document Revised: 01/12/2017 Document Reviewed: 10/23/2016 Elsevier Patient Education  2020 [employer] Postpartum Baby Blues The postpartum period begins right after the birth of a baby. During this time, there is often a lot of joy  and excitement. It is also a time of many changes in the life of the parents. No matter how many times a mother gives birth, each child brings new challenges to the family, including different ways of relating to one another. It is common to have feelings of excitement along with confusing changes in moods, emotions, and thoughts. You may feel happy one minute and sad or stressed the next. These feelings of sadness usually happen in the period right after you have your baby, and they go away within a week or two. This is called the "baby blues." What are the causes? There is no known cause of baby blues. It is likely caused by a combination of factors. However, changes in hormone levels after childbirth are believed to trigger some of the symptoms. Other factors that can play a role in these mood changes include:  Lack of sleep.  Stressful life events, such as poverty, caring for a loved one, or death of a loved one.  Genetics. What are the signs or symptoms? Symptoms of this condition include:  Brief changes in mood, such as going from extreme happiness to sadness.  Decreased concentration.  Difficulty sleeping.  Crying spells and tearfulness.  Loss of appetite.  Irritability.  Anxiety. If the symptoms of baby blues last for more than 2 weeks or become more severe, you may have postpartum depression. How is this diagnosed? This condition is diagnosed based on an evaluation of your symptoms. There are no medical or lab tests that lead to a diagnosis, but there are various questionnaires that a health care provider may use to identify women with the baby blues or postpartum depression. How is this treated? Treatment is not needed for this condition. The baby blues usually go away on their own in 1-2 weeks. Social support is often all that is needed. You will be encouraged to get adequate sleep and rest. Follow these instructions at home: Lifestyle      Get as much rest as you  can. Take a nap when the baby sleeps.  Exercise regularly as told by your health care provider. Some women find yoga and walking to be helpful.  Eat a balanced and nourishing diet. This includes plenty of fruits and vegetables, whole grains, and lean proteins.  Do little things that you enjoy. Have a cup of tea, take a bubble bath, read your favorite magazine, or listen to your favorite music.  Avoid alcohol.  Ask for help with household chores, cooking, grocery shopping, or running errands. Do not  try to do everything yourself. Consider hiring a postpartum doula to help. This is a professional who specializes in providing support to new mothers.  Try not to make any major life changes during pregnancy or right after giving birth. This can add stress. General instructions  Talk to people close to you about how you are feeling. Get support from your partner, family members, friends, or other new moms. You may want to join a support group.  Find ways to cope with stress. This may include: ? Writing your thoughts and feelings in a journal. ? Spending time outside. ? Spending time with people who make you laugh.  Try to stay positive in how you think. Think about the things you are grateful for.  Take over-the-counter and prescription medicines only as told by your health care provider.  Let your health care provider know if you have any concerns.  Keep all postpartum visits as told by your health care provider. This is important. Contact a health care provider if:  Your baby blues do not go away after 2 weeks. Get help right away if:  You have thoughts of taking your own life (suicidal thoughts).  You think you may harm the baby or other people.  You see or hear things that are not there (hallucinations). Summary  After giving birth, you may feel happy one minute and sad or stressed the next. Feelings of sadness that happen right after the baby is born and go away after a week  or two are called the "baby blues."  You can manage the baby blues by getting enough rest, eating a healthy diet, exercising, spending time with supportive people, and finding ways to cope with stress.  If feelings of sadness and stress last longer than 2 weeks or get in the way of caring for your baby, talk to your health care provider. This may mean you have postpartum depression. This information is not intended to replace advice given to you by your health care provider. Make sure you discuss any questions you have with your health care provider. Document Released: 10/14/2003 Document Revised: 05/03/2018 Document Reviewed: 03/07/2016 Elsevier Patient Education  2020 [employer] Breastfeeding  Choosing to breastfeed is one of the best decisions you can make for yourself and your baby. A change in hormones during pregnancy causes your breasts to make breast milk in your milk-producing glands. Hormones prevent breast milk from being released before your baby is born. They also prompt milk flow after birth. Once breastfeeding has begun, thoughts of your baby, as well as his or her sucking or crying, can stimulate the release of milk from your milk-producing glands. Benefits of breastfeeding Research shows that breastfeeding offers many health benefits for infants and mothers. It also offers a cost-free and convenient way to feed your baby. For your baby  Your first milk (colostrum) helps your baby's digestive system to function better.  Special cells in your milk (antibodies) help your baby to fight off infections.  Breastfed babies are less likely to develop asthma, allergies, obesity, or type 2 diabetes. They are also at lower risk for sudden infant death syndrome (SIDS).  Nutrients in breast milk are better able to meet your babys needs compared to infant formula.  Breast milk improves your baby's brain development. For you  Breastfeeding helps to create a very special bond between  you and your baby.  Breastfeeding is convenient. Breast milk costs nothing and is always available at the correct temperature.  Breastfeeding helps  to burn calories. It helps you to lose the weight that you gained during pregnancy.  Breastfeeding makes your uterus return faster to its size before pregnancy. It also slows bleeding (lochia) after you give birth.  Breastfeeding helps to lower your risk of developing type 2 diabetes, osteoporosis, rheumatoid arthritis, cardiovascular disease, and breast, ovarian, uterine, and endometrial cancer later in life. Breastfeeding basics Starting breastfeeding  Find a comfortable place to sit or lie down, with your neck and back well-supported.  Place a pillow or a rolled-up blanket under your baby to bring him or her to the level of your breast (if you are seated). Nursing pillows are specially designed to help support your arms and your baby while you breastfeed.  Make sure that your baby's tummy (abdomen) is facing your abdomen.  Gently massage your breast. With your fingertips, massage from the outer edges of your breast inward toward the nipple. This encourages milk flow. If your milk flows slowly, you may need to continue this action during the feeding.  Support your breast with 4 fingers underneath and your thumb above your nipple (make the letter "C" with your hand). Make sure your fingers are well away from your nipple and your babys mouth.  Stroke your baby's lips gently with your finger or nipple.  When your baby's mouth is open wide enough, quickly bring your baby to your breast, placing your entire nipple and as much of the areola as possible into your baby's mouth. The areola is the colored area around your nipple. ? More areola should be visible above your baby's upper lip than below the lower lip. ? Your baby's lips should be opened and extended outward (flanged) to ensure an adequate, comfortable latch. ? Your baby's tongue should be  between his or her lower gum and your breast.  Make sure that your baby's mouth is correctly positioned around your nipple (latched). Your baby's lips should create a seal on your breast and be turned out (everted).  It is common for your baby to suck about 2-3 minutes in order to start the flow of breast milk. Latching Teaching your baby how to latch onto your breast properly is very important. An improper latch can cause nipple pain, decreased milk supply, and poor weight gain in your baby. Also, if your baby is not latched onto your nipple properly, he or she may swallow some air during feeding. This can make your baby fussy. Burping your baby when you switch breasts during the feeding can help to get rid of the air. However, teaching your baby to latch on properly is still the best way to prevent fussiness from swallowing air while breastfeeding. Signs that your baby has successfully latched onto your nipple  Silent tugging or silent sucking, without causing you pain. Infant's lips should be extended outward (flanged).  Swallowing heard between every 3-4 sucks once your milk has started to flow (after your let-down milk reflex occurs).  Muscle movement above and in front of his or her ears while sucking. Signs that your baby has not successfully latched onto your nipple  Sucking sounds or smacking sounds from your baby while breastfeeding.  Nipple pain. If you think your baby has not latched on correctly, slip your finger into the corner of your babys mouth to break the suction and place it between your baby's gums. Attempt to start breastfeeding again. Signs of successful breastfeeding Signs from your baby  Your baby will gradually decrease the number of sucks or will  completely stop sucking.  Your baby will fall asleep.  Your baby's body will relax.  Your baby will retain a small amount of milk in his or her mouth.  Your baby will let go of your breast by himself or  herself. Signs from you  Breasts that have increased in firmness, weight, and size 1-3 hours after feeding.  Breasts that are softer immediately after breastfeeding.  Increased milk volume, as well as a change in milk consistency and color by the fifth day of breastfeeding.  Nipples that are not sore, cracked, or bleeding. Signs that your baby is getting enough milk  Wetting at least 1-2 diapers during the first 24 hours after birth.  Wetting at least 5-6 diapers every 24 hours for the first week after birth. The urine should be clear or pale yellow by the age of 5 days.  Wetting 6-8 diapers every 24 hours as your baby continues to grow and develop.  At least 3 stools in a 24-hour period by the age of 5 days. The stool should be soft and yellow.  At least 3 stools in a 24-hour period by the age of 7 days. The stool should be seedy and yellow.  No loss of weight greater than 10% of birth weight during the first 3 days of life.  Average weight gain of 4-7 oz (113-198 g) per week after the age of 4 days.  Consistent daily weight gain by the age of 5 days, without weight loss after the age of 2 weeks. After a feeding, your baby may spit up a small amount of milk. This is normal. Breastfeeding frequency and duration Frequent feeding will help you make more milk and can prevent sore nipples and extremely full breasts (breast engorgement). Breastfeed when you feel the need to reduce the fullness of your breasts or when your baby shows signs of hunger. This is called "breastfeeding on demand." Signs that your baby is hungry include:  Increased alertness, activity, or restlessness.  Movement of the head from side to side.  Opening of the mouth when the corner of the mouth or cheek is stroked (rooting).  Increased sucking sounds, smacking lips, cooing, sighing, or squeaking.  Hand-to-mouth movements and sucking on fingers or hands.  Fussing or crying. Avoid introducing a pacifier to  your baby in the first 4-6 weeks after your baby is born. After this time, you may choose to use a pacifier. Research has shown that pacifier use during the first year of a baby's life decreases the risk of sudden infant death syndrome (SIDS). Allow your baby to feed on each breast as long as he or she wants. When your baby unlatches or falls asleep while feeding from the first breast, offer the second breast. Because newborns are often sleepy in the first few weeks of life, you may need to awaken your baby to get him or her to feed. Breastfeeding times will vary from baby to baby. However, the following rules can serve as a guide to help you make sure that your baby is properly fed:  Newborns (babies 79 weeks of age or younger) may breastfeed every 1-3 hours.  Newborns should not go without breastfeeding for longer than 3 hours during the day or 5 hours during the night.  You should breastfeed your baby a minimum of 8 times in a 24-hour period. Breast milk pumping     Pumping and storing breast milk allows you to make sure that your baby is exclusively fed your  breast milk, even at times when you are unable to breastfeed. This is especially important if you go back to work while you are still breastfeeding, or if you are not able to be present during feedings. Your lactation consultant can help you find a method of pumping that works best for you and give you guidelines about how long it is safe to store breast milk. Caring for your breasts while you breastfeed Nipples can become dry, cracked, and sore while breastfeeding. The following recommendations can help keep your breasts moisturized and healthy:  Avoid using soap on your nipples.  Wear a supportive bra designed especially for nursing. Avoid wearing underwire-style bras or extremely tight bras (sports bras).  Air-dry your nipples for 3-4 minutes after each feeding.  Use only cotton bra pads to absorb leaked breast milk. Leaking of  breast milk between feedings is normal.  Use lanolin on your nipples after breastfeeding. Lanolin helps to maintain your skin's normal moisture barrier. Pure lanolin is not harmful (not toxic) to your baby. You may also hand express a few drops of breast milk and gently massage that milk into your nipples and allow the milk to air-dry. In the first few weeks after giving birth, some women experience breast engorgement. Engorgement can make your breasts feel heavy, warm, and tender to the touch. Engorgement peaks within 3-5 days after you give birth. The following recommendations can help to ease engorgement:  Completely empty your breasts while breastfeeding or pumping. You may want to start by applying warm, moist heat (in the shower or with warm, water-soaked hand towels) just before feeding or pumping. This increases circulation and helps the milk flow. If your baby does not completely empty your breasts while breastfeeding, pump any extra milk after he or she is finished.  Apply ice packs to your breasts immediately after breastfeeding or pumping, unless this is too uncomfortable for you. To do this: ? Put ice in a plastic bag. ? Place a towel between your skin and the bag. ? Leave the ice on for 20 minutes, 2-3 times a day.  Make sure that your baby is latched on and positioned properly while breastfeeding. If engorgement persists after 48 hours of following these recommendations, contact your health care provider or a Advertising copywriter. Overall health care recommendations while breastfeeding  Eat 3 healthy meals and 3 snacks every day. Well-nourished mothers who are breastfeeding need an additional 450-500 calories a day. You can meet this requirement by increasing the amount of a balanced diet that you eat.  Drink enough water to keep your urine pale yellow or clear.  Rest often, relax, and continue to take your prenatal vitamins to prevent fatigue, stress, and low vitamin and mineral  levels in your body (nutrient deficiencies).  Do not use any products that contain nicotine or tobacco, such as cigarettes and e-cigarettes. Your baby may be harmed by chemicals from cigarettes that pass into breast milk and exposure to secondhand smoke. If you need help quitting, ask your health care provider.  Avoid alcohol.  Do not use illegal drugs or marijuana.  Talk with your health care provider before taking any medicines. These include over-the-counter and prescription medicines as well as vitamins and herbal supplements. Some medicines that may be harmful to your baby can pass through breast milk.  It is possible to become pregnant while breastfeeding. If birth control is desired, ask your health care provider about options that will be safe while breastfeeding your baby. Where to find  more information: [employer]: www.llli.org Contact a health care provider if:  You feel like you want to stop breastfeeding or have become frustrated with breastfeeding.  Your nipples are cracked or bleeding.  Your breasts are red, tender, or warm.  You have: ? Painful breasts or nipples. ? A swollen area on either breast. ? A fever or chills. ? Nausea or vomiting. ? Drainage other than breast milk from your nipples.  Your breasts do not become full before feedings by the fifth day after you give birth.  You feel sad and depressed.  Your baby is: ? Too sleepy to eat well. ? Having trouble sleeping. ? More than 61 week old and wetting fewer than 6 diapers in a 24-hour period. ? Not gaining weight by 285 days of age.  Your baby has fewer than 3 stools in a 24-hour period.  Your baby's skin or the white parts of his or her eyes become yellow. Get help right away if:  Your baby is overly tired (lethargic) and does not want to wake up and feed.  Your baby develops an unexplained fever. Summary  Breastfeeding offers many health benefits for infant and mothers.  Try  to breastfeed your infant when he or she shows early signs of hunger.  Gently tickle or stroke your baby's lips with your finger or nipple to allow the baby to open his or her mouth. Bring the baby to your breast. Make sure that much of the areola is in your baby's mouth. Offer one side and burp the baby before you offer the other side.  Talk with your health care provider or lactation consultant if you have questions or you face problems as you breastfeed. This information is not intended to replace advice given to you by your health care provider. Make sure you discuss any questions you have with your health care provider. Document Released: 01/09/2005 Document Revised: 04/05/2017 Document Reviewed: 02/11/2016 Elsevier Patient Education  2020 [employer]

## 2018-08-27 LAB — RPR: RPR Ser Ql: NONREACTIVE

## 2018-08-27 MED ORDER — VARICELLA VIRUS VACCINE LIVE 1350 PFU/0.5ML IJ SUSR
0.5000 mL | Freq: Once | INTRAMUSCULAR | Status: AC
  Administered 2018-08-27: 0.5 mL via SUBCUTANEOUS
  Filled 2018-08-27: qty 0.5

## 2018-08-27 MED ORDER — IBUPROFEN 600 MG PO TABS: 600.0000 mg | ORAL_TABLET | Freq: Four times a day (QID) | ORAL | 0 refills | Status: DC

## 2018-08-27 NOTE — Anesthesia Postprocedure Evaluation (Signed)
Anesthesia Post Note  Patient: Jetty Peeks  Procedure(s) Performed: AN AD HOC LABOR EPIDURAL  Patient location during evaluation: Mother Baby Anesthesia Type: Epidural Level of consciousness: awake and alert and oriented Pain management: pain level controlled Vital Signs Assessment: post-procedure vital signs reviewed and stable Respiratory status: respiratory function stable Cardiovascular status: stable Postop Assessment: no headache, no backache, patient able to bend at knees, no apparent nausea or vomiting, able to ambulate and adequate PO intake Anesthetic complications: no     Last Vitals:  Vitals:   08/26/18 2305 08/27/18 0743  BP: 98/68 114/72  Pulse: 83 75  Resp: 18 20  Temp: 36.9 C 36.5 C  SpO2: 98% 99%    Last Pain:  Vitals:   08/27/18 0743  TempSrc: Oral  PainSc:                  Lanora Manis

## 2018-08-27 NOTE — Discharge Summary (Signed)
  Obstetric Discharge Summary  Patient ID: Shelby Kelly MRN: [id number] DOB/AGE: 1996-01-11 23 y.o.   Date of Admission: 08/25/2018  Date of Discharge:  08/27/18  Admitting Diagnosis: Onset of Labor at [redacted]w[redacted]d  Secondary Diagnosis: Short interval pregnancy, Group Beta Strep Positive, History of asthma, History of tachycardia  Mode of Delivery: Normal spontaneous vaginal delivery     Discharge Diagnosis: No other diagnosis   Intrapartum Procedures: Atificial rupture of membranes and epidural   Post partum procedures: None  Complications: None   Brief Hospital Course  Shelby Kelly is a [id number] who had a SVD on 08/26/2018;  for further details of this birth, please refer to the delivey summary.  Patient had an uncomplicated postpartum course.  By time of discharge on PPD#1, her pain was controlled on oral pain medications; she had appropriate lochia and was ambulating, voiding without difficulty and tolerating regular diet.  She was deemed stable for discharge to home.    Labs:  CBC Latest Ref Rng & Units 08/26/2018 08/25/2018 06/21/2018  WBC 4.0 - 10.5 K/uL 14.3(H) 12.6(H) 9.8  Hemoglobin 12.0 - 15.0 g/dL 11.5(L) 12.2 12.6  Hematocrit 36.0 - 46.0 % 33.3(L) 35.3(L) 36.9  Platelets 150 - 400 K/uL 191 244 201   A POS  Physical exam:   Temp:  [97.7 F (36.5 C)-98.9 F (37.2 C)] 97.7 F (36.5 C) (08/04 0743) Pulse Rate:  [68-83] 75 (08/04 0743) Resp:  [18-20] 20 (08/04 0743) BP: (98-117)/(67-74) 114/72 (08/04 0743) SpO2:  [98 %-99 %] 99 % (08/04 0743)  General: alert and no distress  Lochia: appropriate  Abdomen: soft, NT  Uterine Fundus: firm  Perineum: no significant drainage, no dehiscence, no significant erythema  Extremities: No evidence of DVT seen on physical exam. No lower extremity edema.  Discharge Instructions: Per After Visit Summary.  Activity: Advance as tolerated. Pelvic rest for 6 weeks.  Also refer to After Visit Summary  Diet:  Regular  Medications: Allergies as of 08/27/2018      Reactions   Aspergillus (mixed) Allergy Skin Test Hives, Itching, Shortness Of Breath, Swelling      Medication List    TAKE these medications   ibuprofen 600 MG tablet Commonly known as: ADVIL Take 1 tablet (600 mg total) by mouth every 6 (six) hours.   Prenatal Gummies/DHA & FA 0.4-32.5 MG Chew Chew by mouth daily.      Outpatient follow up:  Follow-up Information    [place], Raiford Simmonds, CNM. Schedule an appointment as soon as possible for a visit in 6 week(s).   Specialties: Obstetrics and Gynecology, Radiology Why: Please call to schedule six (6) week postpartum visit Contact information: [address] [place] [phone]          Postpartum contraception: IUD  Discharged Condition: stable  Discharged to: home   Newborn Data:  Disposition:home with mother  Apgars: APGAR (1 MIN): 8   APGAR (5 MINS): 9    Baby Feeding: Bottle   Diona Fanti, CNM [facility], [facility] 08/27/18 10:03 AM

## 2018-08-27 NOTE — Plan of Care (Signed)
Vs stable; up ad lib; tolerating regular diet; taking motrin and tylenol for pain control; breast and formula now; has asked about going home today

## 2018-08-27 NOTE — Progress Notes (Signed)
Infant's urine drug screen is negative. Infant's cord tissue screen is still pending. Clinical Social Worker (CSW) will continue to follow and assist as needed.   Aasim Restivo, LCSW [phone]  

## 2018-08-27 NOTE — Lactation Note (Signed)
This note was copied from a baby's chart. Lactation Consultation Note  Patient Name: Shelby Kelly Today's Date: 08/27/2018     Maternal Data    Feeding Feeding Type: Bottle Fed - Formula Nipple Type: Slow - flow  LATCH Score                   Interventions    Lactation Tools Discussed/Used     Consult Status  Lactation spoke with mother about progress of breastfeeding. Mother plans on both breast and bottle feeding. [place] reminded mother of importance of  breastfeeding first and following up with formula if needed if infant is still showing hunger cues. Mother is an experienced breastfeeder and did the same with her first child. She states that her nipples are sore but no trauma is present. Mother has coconut oil and comfort gels.  Mother is connected with [facility] and has called to set up her first appointment. Mother was given information on virtual breastfeeding support group.    Shelby Kelly 0/01/930, 11:14 AM

## 2018-08-27 NOTE — Progress Notes (Signed)
Patient discharged home with infant. Discharge instructions and prescriptions given and reviewed with patient. Patient verbalized understanding. Escorted out by staff.  

## 2018-08-28 ENCOUNTER — Encounter: Payer: Medicaid Other | Admitting: Obstetrics and Gynecology

## 2018-08-30 NOTE — Progress Notes (Signed)
8/7: Infant's cord tissue screen is negative. A child protective services (CPS) report will not be made.   Jasier Calabretta, LCSW [phone]   

## 2018-09-10 ENCOUNTER — Other Ambulatory Visit: Payer: Self-pay | Admitting: Obstetrics and Gynecology

## 2018-09-10 ENCOUNTER — Other Ambulatory Visit: Payer: Self-pay | Admitting: *Deleted

## 2018-09-10 MED ORDER — NORETHIN ACE-ETH ESTRAD-FE 1-20 MG-MCG PO TABS: 1.0000 | ORAL_TABLET | Freq: Every day | ORAL | 11 refills | Status: DC

## 2018-09-10 MED ORDER — NORETHINDRONE 0.35 MG PO TABS: 1.0000 | ORAL_TABLET | Freq: Every day | ORAL | 11 refills | Status: DC

## 2018-09-20 ENCOUNTER — Other Ambulatory Visit: Payer: Self-pay

## 2018-09-20 MED ORDER — NORETHIN ACE-ETH ESTRAD-FE 1-20 MG-MCG PO TABS: 1.0000 | ORAL_TABLET | Freq: Every day | ORAL | 11 refills | Status: DC

## 2018-09-20 NOTE — Telephone Encounter (Signed)
mychart message sent

## 2018-09-24 ENCOUNTER — Telehealth: Payer: Self-pay | Admitting: Obstetrics and Gynecology

## 2018-09-24 NOTE — Telephone Encounter (Signed)
Friend Pregnancy Care manager from [facility] called and stated that she was not able to reach the patient and needs to verify the patients contact information. She also checked to see if the patient had a post partum apt scheduled. Please advise.

## 2018-09-24 NOTE — Telephone Encounter (Signed)
She is a midwife patient.

## 2018-09-25 NOTE — Telephone Encounter (Signed)
When I spoke with Shelby Kelly I gave her that updated information already. I sent a message for documentation. Thank you.

## 2018-09-25 NOTE — Telephone Encounter (Signed)
Your welcome.

## 2018-10-07 ENCOUNTER — Ambulatory Visit (INDEPENDENT_AMBULATORY_CARE_PROVIDER_SITE_OTHER): Payer: Medicaid Other | Admitting: Certified Nurse Midwife

## 2018-10-07 ENCOUNTER — Other Ambulatory Visit: Payer: Self-pay

## 2018-10-07 ENCOUNTER — Encounter: Payer: Self-pay | Admitting: Certified Nurse Midwife

## 2018-10-07 MED ORDER — NORGESTIMATE-ETH ESTRADIOL 0.25-35 MG-MCG PO TABS: 1.0000 | ORAL_TABLET | Freq: Every day | ORAL | 11 refills | Status: DC

## 2018-10-07 NOTE — Progress Notes (Signed)
Subjective:    Shelby Kelly is a 23 y.o. [id number] Caucasian female who presents for a postpartum visit. She is 6 weeks postpartum following a spontaneous vaginal delivery at 39.1 gestational weeks. Anesthesia: epidural. I have fully reviewed the prenatal and intrapartum course. Postpartum course has been WNL. Baby's course has been WNL. Baby is feeding by bottle. Bleeding bleeding, started birth control . Bowel function is normal. Bladder function is normal. Patient is sexually active.Contraception method is OCP (estrogen/progesterone). Postpartum depression screening: negative. Score 4.  Last pap 1/3//20 and was negative.  The following portions of the patient's history were reviewed and updated as appropriate: allergies, current medications, past medical history, past surgical history and problem list.  Review of Systems Pertinent items are noted in HPI.   There were no vitals filed for this visit. No LMP recorded.  Objective:   General:  alert, cooperative and no distress   Breasts:  deferred, no complaints  Lungs: clear to auscultation bilaterally  Heart:  regular rate and rhythm  Abdomen: soft, nontender   Vulva: normal  Vagina: normal vagina  Cervix:  closed  Corpus: Well-involuted  Adnexa:  Non-palpable  Rectal Exam: no hemorrhoids        Assessment:   Postpartum exam 6 wks s/p SVD Bottle feeding Depression screening Contraception counseling   Plan:  : OCP (estrogen/progesterone), she is having break through bleeding, discussed changing to higher estrogen dose. Switched [employer] to [employer]  Follow up in: PRN for IUD placement or earlier if needed.   Philip Aspen, CNM

## 2018-10-07 NOTE — Patient Instructions (Signed)
Preventive Care 21-21 Years Old, Female Preventive care refers to visits with your health care provider and lifestyle choices that can promote health and wellness. This includes:  A yearly physical exam. This may also be called an annual well check.  Regular dental visits and eye exams.  Immunizations.  Screening for certain conditions.  Healthy lifestyle choices, such as eating a healthy diet, getting regular exercise, not using drugs or products that contain nicotine and tobacco, and limiting alcohol use. What can I expect for my preventive care visit? Physical exam Your health care provider will check your:  Height and weight. This may be used to calculate body mass index (BMI), which tells if you are at a healthy weight.  Heart rate and blood pressure.  Skin for abnormal spots. Counseling Your health care provider may ask you questions about your:  Alcohol, tobacco, and drug use.  Emotional well-being.  Home and relationship well-being.  Sexual activity.  Eating habits.  Work and work environment.  Method of birth control.  Menstrual cycle.  Pregnancy history. What immunizations do I need?  Influenza (flu) vaccine  This is recommended every year. Tetanus, diphtheria, and pertussis (Tdap) vaccine  You may need a Td booster every 10 years. Varicella (chickenpox) vaccine  You may need this if you have not been vaccinated. Human papillomavirus (HPV) vaccine  If recommended by your health care provider, you may need three doses over 6 months. Measles, mumps, and rubella (MMR) vaccine  You may need at least one dose of MMR. You may also need a second dose. Meningococcal conjugate (MenACWY) vaccine  One dose is recommended if you are age 19-21 years and a first-year college student living in a residence hall, or if you have one of several medical conditions. You may also need additional booster doses. Pneumococcal conjugate (PCV13) vaccine  You may need  this if you have certain conditions and were not previously vaccinated. Pneumococcal polysaccharide (PPSV23) vaccine  You may need one or two doses if you smoke cigarettes or if you have certain conditions. Hepatitis A vaccine  You may need this if you have certain conditions or if you travel or work in places where you may be exposed to hepatitis A. Hepatitis B vaccine  You may need this if you have certain conditions or if you travel or work in places where you may be exposed to hepatitis B. Haemophilus influenzae type b (Hib) vaccine  You may need this if you have certain conditions. You may receive vaccines as individual doses or as more than one vaccine together in one shot (combination vaccines). Talk with your health care provider about the risks and benefits of combination vaccines. What tests do I need?  Blood tests  Lipid and cholesterol levels. These may be checked every 5 years starting at age 20.  Hepatitis C test.  Hepatitis B test. Screening  Diabetes screening. This is done by checking your blood sugar (glucose) after you have not eaten for a while (fasting).  Sexually transmitted disease (STD) testing.  BRCA-related cancer screening. This may be done if you have a family history of breast, ovarian, tubal, or peritoneal cancers.  Pelvic exam and Pap test. This may be done every 3 years starting at age 21. Starting at age 30, this may be done every 5 years if you have a Pap test in combination with an HPV test. Talk with your health care provider about your test results, treatment options, and if necessary, the need for more tests.   Follow these instructions at home: Eating and drinking   Eat a diet that includes fresh fruits and vegetables, whole grains, lean protein, and low-fat dairy.  Take vitamin and mineral supplements as recommended by your health care provider.  Do not drink alcohol if: ? Your health care provider tells you not to drink. ? You are  pregnant, may be pregnant, or are planning to become pregnant.  If you drink alcohol: ? Limit how much you have to 0-1 drink a day. ? Be aware of how much alcohol is in your drink. In the [place] one drink equals one 12 oz bottle of beer (355 mL), one 5 oz glass of wine (148 mL), or one 1 oz glass of hard liquor (44 mL). Lifestyle  Take daily care of your teeth and gums.  Stay active. Exercise for at least 30 minutes on 5 or more days each week.  Do not use any products that contain nicotine or tobacco, such as cigarettes, e-cigarettes, and chewing tobacco. If you need help quitting, ask your health care provider.  If you are sexually active, practice safe sex. Use a condom or other form of birth control (contraception) in order to prevent pregnancy and STIs (sexually transmitted infections). If you plan to become pregnant, see your health care provider for a preconception visit. What's next?  Visit your health care provider once a year for a well check visit.  Ask your health care provider how often you should have your eyes and teeth checked.  Stay up to date on all vaccines. This information is not intended to replace advice given to you by your health care provider. Make sure you discuss any questions you have with your health care provider. Document Released: 03/07/2001 Document Revised: 09/20/2017 Document Reviewed: 09/20/2017 Elsevier Patient Education  2020 Elsevier Inc.  

## 2018-11-05 ENCOUNTER — Other Ambulatory Visit: Payer: Self-pay

## 2018-11-05 MED ORDER — NORETHIN ACE-ETH ESTRAD-FE 1-20 MG-MCG PO TABS: 1.0000 | ORAL_TABLET | Freq: Every day | ORAL | 10 refills | Status: DC

## 2018-11-05 MED ORDER — IBUPROFEN 600 MG PO TABS: 600.0000 mg | ORAL_TABLET | Freq: Four times a day (QID) | ORAL | 0 refills | Status: DC

## 2018-11-05 NOTE — Telephone Encounter (Signed)
Refill sent, mychart message sent

## 2019-01-13 ENCOUNTER — Other Ambulatory Visit: Payer: Self-pay | Admitting: Certified Nurse Midwife

## 2019-01-13 MED ORDER — IBUPROFEN 600 MG PO TABS: 600.0000 mg | ORAL_TABLET | Freq: Four times a day (QID) | ORAL | 0 refills | Status: DC

## 2019-01-13 MED ORDER — NORETHINDRONE-ETH ESTRADIOL 1-35 MG-MCG PO TABS: 1.0000 | ORAL_TABLET | Freq: Every day | ORAL | 11 refills | Status: DC

## 2019-01-13 NOTE — Progress Notes (Signed)
Pill changed to higher dose due to continued bleeding.   Philip Aspen, CNM

## 2019-01-21 ENCOUNTER — Other Ambulatory Visit: Payer: Self-pay | Admitting: Certified Nurse Midwife

## 2019-01-21 MED ORDER — TRANEXAMIC ACID 650 MG PO TABS: 1300.0000 mg | ORAL_TABLET | Freq: Three times a day (TID) | ORAL | 0 refills | Status: AC

## 2019-01-21 NOTE — Progress Notes (Signed)
Pt having heavy bleeding on BC pills, would like to try lysteda for management.  Philip Aspen, CNM

## 2019-04-27 ENCOUNTER — Other Ambulatory Visit: Payer: Self-pay | Admitting: Certified Nurse Midwife

## 2019-05-15 ENCOUNTER — Other Ambulatory Visit: Payer: Self-pay

## 2019-05-15 MED ORDER — NORETHINDRONE-ETH ESTRADIOL 1-35 MG-MCG PO TABS: 1.0000 | ORAL_TABLET | Freq: Every day | ORAL | 2 refills | Status: DC

## 2019-06-12 ENCOUNTER — Other Ambulatory Visit: Payer: Self-pay | Admitting: Certified Nurse Midwife

## 2019-07-21 ENCOUNTER — Other Ambulatory Visit: Payer: Self-pay

## 2019-07-22 ENCOUNTER — Other Ambulatory Visit: Payer: Self-pay | Admitting: Certified Nurse Midwife

## 2019-09-19 ENCOUNTER — Other Ambulatory Visit: Payer: Self-pay

## 2019-09-19 MED ORDER — ALYACEN 1/35 1-35 MG-MCG PO TABS: 1.0000 | ORAL_TABLET | Freq: Every day | ORAL | 0 refills | Status: DC

## 2019-09-20 ENCOUNTER — Other Ambulatory Visit: Payer: Self-pay | Admitting: Certified Nurse Midwife

## 2019-09-22 ENCOUNTER — Other Ambulatory Visit: Payer: Self-pay

## 2019-09-22 MED ORDER — ALYACEN 1/35 1-35 MG-MCG PO TABS: 1.0000 | ORAL_TABLET | Freq: Every day | ORAL | 0 refills | Status: DC

## 2019-10-08 ENCOUNTER — Ambulatory Visit (INDEPENDENT_AMBULATORY_CARE_PROVIDER_SITE_OTHER): Payer: Medicaid Other | Admitting: Certified Nurse Midwife

## 2019-10-08 ENCOUNTER — Other Ambulatory Visit: Payer: Self-pay

## 2019-10-08 ENCOUNTER — Encounter: Payer: Self-pay | Admitting: Certified Nurse Midwife

## 2019-10-08 ENCOUNTER — Other Ambulatory Visit (HOSPITAL_COMMUNITY)
Admission: RE | Admit: 2019-10-08 | Discharge: 2019-10-08 | Disposition: A | Discharge status: Home / Self Care | Payer: Medicaid Other | Source: Ambulatory Visit | Attending: Certified Nurse Midwife | Admitting: Certified Nurse Midwife

## 2019-10-08 VITALS — BP 102/59 | HR 64 | Ht 59.0 in | Wt 102.2 lb

## 2019-10-08 DIAGNOSIS — Z13220 Encounter for screening for lipoid disorders: Secondary | ICD-10-CM

## 2019-10-08 DIAGNOSIS — Z113 Encounter for screening for infections with a predominantly sexual mode of transmission: Secondary | ICD-10-CM

## 2019-10-08 DIAGNOSIS — Z01419 Encounter for gynecological examination (general) (routine) without abnormal findings: Secondary | ICD-10-CM

## 2019-10-08 DIAGNOSIS — Z0000 Encounter for general adult medical examination without abnormal findings: Secondary | ICD-10-CM

## 2019-10-08 DIAGNOSIS — Z1159 Encounter for screening for other viral diseases: Secondary | ICD-10-CM | POA: Diagnosis not present

## 2019-10-08 MED ORDER — ALYACEN 1/35 1-35 MG-MCG PO TABS
1.0000 | ORAL_TABLET | Freq: Every day | ORAL | 3 refills | Status: DC
Start: 2019-10-08 — End: 2020-10-13

## 2019-10-08 NOTE — Patient Instructions (Signed)
Preventive Care 21-22 Years Old, Female Preventive care refers to visits with your health care provider and lifestyle choices that can promote health and wellness. This includes:  A yearly physical exam. This may also be called an annual well check.  Regular dental visits and eye exams.  Immunizations.  Screening for certain conditions.  Healthy lifestyle choices, such as eating a healthy diet, getting regular exercise, not using drugs or products that contain nicotine and tobacco, and limiting alcohol use. What can I expect for my preventive care visit? Physical exam Your health care provider will check your:  Height and weight. This may be used to calculate body mass index (BMI), which tells if you are at a healthy weight.  Heart rate and blood pressure.  Skin for abnormal spots. Counseling Your health care provider may ask you questions about your:  Alcohol, tobacco, and drug use.  Emotional well-being.  Home and relationship well-being.  Sexual activity.  Eating habits.  Work and work environment.  Method of birth control.  Menstrual cycle.  Pregnancy history. What immunizations do I need?  Influenza (flu) vaccine  This is recommended every year. Tetanus, diphtheria, and pertussis (Tdap) vaccine  You may need a Td booster every 10 years. Varicella (chickenpox) vaccine  You may need this if you have not been vaccinated. Human papillomavirus (HPV) vaccine  If recommended by your health care provider, you may need three doses over 6 months. Measles, mumps, and rubella (MMR) vaccine  You may need at least one dose of MMR. You may also need a second dose. Meningococcal conjugate (MenACWY) vaccine  One dose is recommended if you are age 19-21 years and a first-year college student living in a residence hall, or if you have one of several medical conditions. You may also need additional booster doses. Pneumococcal conjugate (PCV13) vaccine  You may need  this if you have certain conditions and were not previously vaccinated. Pneumococcal polysaccharide (PPSV23) vaccine  You may need one or two doses if you smoke cigarettes or if you have certain conditions. Hepatitis A vaccine  You may need this if you have certain conditions or if you travel or work in places where you may be exposed to hepatitis A. Hepatitis B vaccine  You may need this if you have certain conditions or if you travel or work in places where you may be exposed to hepatitis B. Haemophilus influenzae type b (Hib) vaccine  You may need this if you have certain conditions. You may receive vaccines as individual doses or as more than one vaccine together in one shot (combination vaccines). Talk with your health care provider about the risks and benefits of combination vaccines. What tests do I need?  Blood tests  Lipid and cholesterol levels. These may be checked every 5 years starting at age 20.  Hepatitis C test.  Hepatitis B test. Screening  Diabetes screening. This is done by checking your blood sugar (glucose) after you have not eaten for a while (fasting).  Sexually transmitted disease (STD) testing.  BRCA-related cancer screening. This may be done if you have a family history of breast, ovarian, tubal, or peritoneal cancers.  Pelvic exam and Pap test. This may be done every 3 years starting at age 21. Starting at age 30, this may be done every 5 years if you have a Pap test in combination with an HPV test. Talk with your health care provider about your test results, treatment options, and if necessary, the need for more tests.   Follow these instructions at home: Eating and drinking   Eat a diet that includes fresh fruits and vegetables, whole grains, lean protein, and low-fat dairy.  Take vitamin and mineral supplements as recommended by your health care provider.  Do not drink alcohol if: ? Your health care provider tells you not to drink. ? You are  pregnant, may be pregnant, or are planning to become pregnant.  If you drink alcohol: ? Limit how much you have to 0-1 drink a day. ? Be aware of how much alcohol is in your drink. In the [place] one drink equals one 12 oz bottle of beer (355 mL), one 5 oz glass of wine (148 mL), or one 1 oz glass of hard liquor (44 mL). Lifestyle  Take daily care of your teeth and gums.  Stay active. Exercise for at least 30 minutes on 5 or more days each week.  Do not use any products that contain nicotine or tobacco, such as cigarettes, e-cigarettes, and chewing tobacco. If you need help quitting, ask your health care provider.  If you are sexually active, practice safe sex. Use a condom or other form of birth control (contraception) in order to prevent pregnancy and STIs (sexually transmitted infections). If you plan to become pregnant, see your health care provider for a preconception visit. What's next?  Visit your health care provider once a year for a well check visit.  Ask your health care provider how often you should have your eyes and teeth checked.  Stay up to date on all vaccines. This information is not intended to replace advice given to you by your health care provider. Make sure you discuss any questions you have with your health care provider. Document Revised: 09/20/2017 Document Reviewed: 09/20/2017 Elsevier Patient Education  2020 [employer].
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
## 2019-10-09 LAB — LIPID PANEL
Chol/HDL Ratio: 4.1 ratio (ref 0.0–4.4)
Cholesterol, Total: 171 mg/dL (ref 100–199)
HDL: 42 mg/dL (ref >39)
LDL Chol Calc (NIH): 115 mg/dL — ABNORMAL HIGH (ref 0–99)
Triglycerides: 76 mg/dL (ref 0–149)
VLDL Cholesterol Cal: 14 mg/dL (ref 5–40)

## 2019-10-09 LAB — HEPATITIS C ANTIBODY: Hep C Virus Ab: 0.2 s/co ratio (ref 0.0–0.9)

## 2019-10-10 LAB — CERVICOVAGINAL ANCILLARY ONLY
Bacterial Vaginitis (gardnerella): NEGATIVE
Candida Glabrata: NEGATIVE
Candida Vaginitis: NEGATIVE
Chlamydia: NEGATIVE
Comment: NEGATIVE
Comment: NEGATIVE
Comment: NEGATIVE
Comment: NEGATIVE
Comment: NEGATIVE
Comment: NORMAL
Neisseria Gonorrhea: NEGATIVE
Trichomonas: NEGATIVE

## 2020-10-13 ENCOUNTER — Encounter: Payer: Self-pay | Admitting: Certified Nurse Midwife

## 2020-10-13 ENCOUNTER — Other Ambulatory Visit: Payer: Self-pay

## 2020-10-13 ENCOUNTER — Other Ambulatory Visit (HOSPITAL_COMMUNITY)
Admission: RE | Admit: 2020-10-13 | Discharge: 2020-10-13 | Disposition: A | Discharge status: Home / Self Care | Payer: Medicaid Other | Source: Ambulatory Visit | Attending: Certified Nurse Midwife | Admitting: Certified Nurse Midwife

## 2020-10-13 ENCOUNTER — Ambulatory Visit (INDEPENDENT_AMBULATORY_CARE_PROVIDER_SITE_OTHER): Payer: Medicaid Other | Admitting: Certified Nurse Midwife

## 2020-10-13 VITALS — BP 115/70 | HR 98 | Ht 59.0 in | Wt 103.4 lb

## 2020-10-13 DIAGNOSIS — Z01419 Encounter for gynecological examination (general) (routine) without abnormal findings: Secondary | ICD-10-CM

## 2020-10-13 DIAGNOSIS — F419 Anxiety disorder, unspecified: Secondary | ICD-10-CM | POA: Diagnosis not present

## 2020-10-13 DIAGNOSIS — Z113 Encounter for screening for infections with a predominantly sexual mode of transmission: Secondary | ICD-10-CM | POA: Insufficient documentation

## 2020-10-13 MED ORDER — ALYACEN 1/35 1-35 MG-MCG PO TABS
1.0000 | ORAL_TABLET | Freq: Every day | ORAL | 3 refills | Status: DC
Start: 2020-10-13 — End: 2021-12-08

## 2020-10-13 MED ORDER — PAROXETINE HCL 10 MG PO TABS: 10.0000 mg | ORAL_TABLET | Freq: Every day | ORAL | 1 refills | Status: DC
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
## 2020-10-13 NOTE — Progress Notes (Signed)
Pt declines flu 

## 2020-10-13 NOTE — Patient Instructions (Signed)
Preventive Care 21-23 Years Old, Female Preventive care refers to lifestyle choices and visits with your health care provider that can promote health and wellness. This includes: A yearly physical exam. This is also called an annual wellness visit. Regular dental and eye exams. Immunizations. Screening for certain conditions. Healthy lifestyle choices, such as: Eating a healthy diet. Getting regular exercise. Not using drugs or products that contain nicotine and tobacco. Limiting alcohol use. What can I expect for my preventive care visit? Physical exam Your health care provider may check your: Height and weight. These may be used to calculate your BMI (body mass index). BMI is a measurement that tells if you are at a healthy weight. Heart rate and blood pressure. Body temperature. Skin for abnormal spots. Counseling Your health care provider may ask you questions about your: Past medical problems. Family's medical history. Alcohol, tobacco, and drug use. Emotional well-being. Home life and relationship well-being. Sexual activity. Diet, exercise, and sleep habits. Work and work environment. Access to firearms. Method of birth control. Menstrual cycle. Pregnancy history. What immunizations do I need? Vaccines are usually given at various ages, according to a schedule. Your health care provider will recommend vaccines for you based on your age, medical history, and lifestyle or other factors, such as travel or where you work. What tests do I need? Blood tests Lipid and cholesterol levels. These may be checked every 5 years starting at age 20. Hepatitis C test. Hepatitis B test. Screening Diabetes screening. This is done by checking your blood sugar (glucose) after you have not eaten for a while (fasting). STD (sexually transmitted disease) testing, if you are at risk. BRCA-related cancer screening. This may be done if you have a family history of breast, ovarian, tubal, or  peritoneal cancers. Pelvic exam and Pap test. This may be done every 3 years starting at age 21. Starting at age 30, this may be done every 5 years if you have a Pap test in combination with an HPV test. Talk with your health care provider about your test results, treatment options, and if necessary, the need for more tests. Follow these instructions at home: Eating and drinking  Eat a healthy diet that includes fresh fruits and vegetables, whole grains, lean protein, and low-fat dairy products. Take vitamin and mineral supplements as recommended by your health care provider. Do not drink alcohol if: Your health care provider tells you not to drink. You are pregnant, may be pregnant, or are planning to become pregnant. If you drink alcohol: Limit how much you have to 0-1 drink a day. Be aware of how much alcohol is in your drink. In the [place] one drink equals one 12 oz bottle of beer (355 mL), one 5 oz glass of wine (148 mL), or one 1 oz glass of hard liquor (44 mL). Lifestyle Take daily care of your teeth and gums. Brush your teeth every morning and night with fluoride toothpaste. Floss one time each day. Stay active. Exercise for at least 30 minutes 5 or more days each week. Do not use any products that contain nicotine or tobacco, such as cigarettes, e-cigarettes, and chewing tobacco. If you need help quitting, ask your health care provider. Do not use drugs. If you are sexually active, practice safe sex. Use a condom or other form of protection to prevent STIs (sexually transmitted infections). If you do not wish to become pregnant, use a form of birth control. If you plan to become pregnant, see your health care provider   for a prepregnancy visit. Find healthy ways to cope with stress, such as: Meditation, yoga, or listening to music. Journaling. Talking to a trusted person. Spending time with friends and family. Safety Always wear your seat belt while driving or riding in a  vehicle. Do not drive: If you have been drinking alcohol. Do not ride with someone who has been drinking. When you are tired or distracted. While texting. Wear a helmet and other protective equipment during sports activities. If you have firearms in your house, make sure you follow all gun safety procedures. Seek help if you have been physically or sexually abused. What's next? Go to your health care provider once a year for an annual wellness visit. Ask your health care provider how often you should have your eyes and teeth checked. Stay up to date on all vaccines. This information is not intended to replace advice given to you by your health care provider. Make sure you discuss any questions you have with your health care provider. Document Revised: 03/19/2020 Document Reviewed: 09/20/2017 Elsevier Patient Education  2022 Elsevier Inc.  

## 2020-10-15 LAB — CERVICOVAGINAL ANCILLARY ONLY
Bacterial Vaginitis (gardnerella): NEGATIVE
Candida Glabrata: NEGATIVE
Candida Vaginitis: NEGATIVE
Chlamydia: NEGATIVE
Comment: NEGATIVE
Comment: NEGATIVE
Comment: NEGATIVE
Comment: NEGATIVE
Comment: NEGATIVE
Comment: NORMAL
Neisseria Gonorrhea: NEGATIVE
Trichomonas: NEGATIVE

## 2020-11-04 ENCOUNTER — Other Ambulatory Visit: Payer: Self-pay | Admitting: Certified Nurse Midwife

## 2020-11-10 ENCOUNTER — Ambulatory Visit (INDEPENDENT_AMBULATORY_CARE_PROVIDER_SITE_OTHER): Payer: Medicaid Other | Admitting: Certified Nurse Midwife

## 2020-11-10 ENCOUNTER — Encounter: Payer: Self-pay | Admitting: Certified Nurse Midwife

## 2020-11-10 ENCOUNTER — Other Ambulatory Visit: Payer: Self-pay

## 2020-11-10 DIAGNOSIS — Z79899 Other long term (current) drug therapy: Secondary | ICD-10-CM

## 2020-11-10 DIAGNOSIS — F419 Anxiety disorder, unspecified: Secondary | ICD-10-CM | POA: Diagnosis not present

## 2020-11-10 MED ORDER — PAROXETINE HCL 20 MG PO TABS: 20.0000 mg | ORAL_TABLET | Freq: Every day | ORAL | 1 refills | Status: DC

## 2020-11-10 NOTE — Progress Notes (Signed)
Virtual Visit via Telephone Note  I connected with Shelby Kelly on 11/10/20 at 11:30 AM EDT by telephone and verified that I am speaking with the correct person using two identifiers.  Location: Patient: at office  Provider: at home   I discussed the limitations, risks, security and privacy concerns of performing an evaluation and management service by telephone and the availability of in person appointments. I also discussed with the patient that there may be a patient responsible charge related to this service. The patient expressed understanding and agreed to proceed.   History of Present Illness: Pt placed on paxil 10 mg daily for anxiety  Observations/Objective: state she has not noticed much of a difference since starting medication. She states things have not been better or worse.   GAD 7 : Generalized Anxiety Score 11/10/2020 10/13/2020  Nervous, Anxious, on Edge 1 2  Control/stop worrying 3 2  Worry too much - different things 3 2  Trouble relaxing 2 1  Restless 0 0  Easily annoyed or irritable 3 3  Afraid - awful might happen 0 0  Total GAD 7 Score 12 10  Anxiety Difficulty - Somewhat difficult    PHQ9 SCORE ONLY 11/10/2020 10/13/2020 10/07/2018  PHQ-9 Total Score 10 17 4       Assessment and Plan: Discussed increasing dose of medications. She is in agreement  Orders placed 20 mg daily   Follow Up Instructions: 4 wks for medication management.    I discussed the assessment and treatment plan with the patient. The patient was provided an opportunity to ask questions and all were answered. The patient agreed with the plan and demonstrated an understanding of the instructions.   The patient was advised to call back or seek an in-person evaluation if the symptoms worsen or if the condition fails to improve as anticipated.  I provided 5  minutes of non-face-to-face time during this encounter.   , CNM

## 2020-11-10 NOTE — Patient Instructions (Signed)
Generalized Anxiety Disorder, Adult Generalized anxiety disorder (GAD) is a mental health condition. Unlike normal worries, anxiety related to GAD is not triggered by a specific event. These worries do not fade or get better with time. GAD interferes with relationships, work, and school. GAD symptoms can vary from mild to severe. People with severe GAD can have intense waves of anxiety with physical symptoms that are similar to panic attacks. What are the causes? The exact cause of GAD is not known, but the following are believed to have an impact: Differences in natural brain chemicals. Genes passed down from parents to children. Differences in the way threats are perceived. Development during childhood. Personality. What increases the risk? The following factors may make you more likely to develop this condition: Being female. Having a family history of anxiety disorders. Being very shy. Experiencing very stressful life events, such as the death of a loved one. Having a very stressful family environment. What are the signs or symptoms? People with GAD often worry excessively about many things in their lives, such as their health and family. Symptoms may also include: Mental and emotional symptoms: Worrying excessively about natural disasters. Fear of being late. Difficulty concentrating. Fears that others are judging your performance. Physical symptoms: Fatigue. Headaches, muscle tension, muscle twitches, trembling, or feeling shaky. Feeling like your heart is pounding or beating very fast. Feeling out of breath or like you cannot take a deep breath. Having trouble falling asleep or staying asleep, or experiencing restlessness. Sweating. Nausea, diarrhea, or irritable bowel syndrome (IBS). Behavioral symptoms: Experiencing erratic moods or irritability. Avoidance of new situations. Avoidance of people. Extreme difficulty making decisions. How is this diagnosed? This condition  is diagnosed based on your symptoms and medical history. You will also have a physical exam. Your health care provider may perform tests to rule out other possible causes of your symptoms. To be diagnosed with GAD, a person must have anxiety that: Is out of his or her control. Affects several different aspects of his or her life, such as work and relationships. Causes distress that makes him or her unable to take part in normal activities. Includes at least three symptoms of GAD, such as restlessness, fatigue, trouble concentrating, irritability, muscle tension, or sleep problems. Before your health care provider can confirm a diagnosis of GAD, these symptoms must be present more days than they are not, and they must last for 6 months or longer. How is this treated? This condition may be treated with: Medicine. Antidepressant medicine is usually prescribed for long-term daily control. Anti-anxiety medicines may be added in severe cases, especially when panic attacks occur. Talk therapy (psychotherapy). Certain types of talk therapy can be helpful in treating GAD by providing support, education, and guidance. Options include: Cognitive behavioral therapy (CBT). People learn coping skills and self-calming techniques to ease their physical symptoms. They learn to identify unrealistic thoughts and behaviors and to replace them with more appropriate thoughts and behaviors. Acceptance and commitment therapy (ACT). This treatment teaches people how to be mindful as a way to cope with unwanted thoughts and feelings. Biofeedback. This process trains you to manage your body's response (physiological response) through breathing techniques and relaxation methods. You will work with a therapist while machines are used to monitor your physical symptoms. Stress management techniques. These include yoga, meditation, and exercise. A mental health specialist can help determine which treatment is best for you. Some  people see improvement with one type of therapy. However, other people require a combination   of therapies. Follow these instructions at home: Lifestyle Maintain a consistent routine and schedule. Anticipate stressful situations. Create a plan, and allow extra time to work with your plan. Practice stress management or self-calming techniques that you have learned from your therapist or your health care provider. General instructions Take over-the-counter and prescription medicines only as told by your health care provider. Understand that you are likely to have setbacks. Accept this and be kind to yourself as you persist to take better care of yourself. Recognize and accept your accomplishments, even if you judge them as small. Keep all follow-up visits as told by your health care provider. This is important. Contact a health care provider if: Your symptoms do not get better. Your symptoms get worse. You have signs of depression, such as: A persistently sad or irritable mood. Loss of enjoyment in activities that used to bring you joy. Change in weight or eating. Changes in sleeping habits. Avoiding friends or family members. Loss of energy for normal tasks. Feelings of guilt or worthlessness. Get help right away if: You have serious thoughts about hurting yourself or others. If you ever feel like you may hurt yourself or others, or have thoughts about taking your own life, get help right away. Go to your nearest emergency department or: Call your local emergency services (911 in the [place] Call a suicide crisis helpline, such as the National Suicide Prevention Lifeline at 1-800-273-8255. This is open 24 hours a day in the [place] Text the Crisis Text Line at 741741 (in the [place] Summary Generalized anxiety disorder (GAD) is a mental health condition that involves worry that is not triggered by a specific event. People with GAD often worry excessively about many things in their lives, such  as their health and family. GAD may cause symptoms such as restlessness, trouble concentrating, sleep problems, frequent sweating, nausea, diarrhea, headaches, and trembling or muscle twitching. A mental health specialist can help determine which treatment is best for you. Some people see improvement with one type of therapy. However, other people require a combination of therapies. This information is not intended to replace advice given to you by your health care provider. Make sure you discuss any questions you have with your health care provider. Document Revised: 10/30/2018 Document Reviewed: 10/30/2018 Elsevier Patient Education  2022 Elsevier Inc.  

## 2020-12-07 ENCOUNTER — Other Ambulatory Visit: Payer: Self-pay | Admitting: Certified Nurse Midwife

## 2020-12-07 ENCOUNTER — Encounter: Payer: Self-pay | Admitting: Certified Nurse Midwife

## 2020-12-07 ENCOUNTER — Telehealth (INDEPENDENT_AMBULATORY_CARE_PROVIDER_SITE_OTHER): Payer: Medicaid Other | Admitting: Certified Nurse Midwife

## 2020-12-07 VITALS — Ht 59.0 in | Wt 103.0 lb

## 2020-12-07 DIAGNOSIS — Z79899 Other long term (current) drug therapy: Secondary | ICD-10-CM

## 2020-12-07 NOTE — Progress Notes (Signed)
Pt left video visit prior to me being able to talk with her. Will reach out to reschedule.   Doreene Burke, CNM

## 2021-04-19 ENCOUNTER — Other Ambulatory Visit: Payer: Self-pay

## 2021-04-19 ENCOUNTER — Encounter: Payer: Self-pay | Admitting: Certified Nurse Midwife

## 2021-04-19 ENCOUNTER — Ambulatory Visit (INDEPENDENT_AMBULATORY_CARE_PROVIDER_SITE_OTHER): Payer: Medicaid Other | Admitting: Certified Nurse Midwife

## 2021-04-19 VITALS — BP 108/69 | HR 93 | Ht 59.0 in | Wt 102.7 lb

## 2021-04-19 DIAGNOSIS — Z3009 Encounter for other general counseling and advice on contraception: Secondary | ICD-10-CM

## 2021-04-19 LAB — POCT URINE PREGNANCY: Preg Test, Ur: NEGATIVE

## 2021-04-19 MED ORDER — MEDROXYPROGESTERONE ACETATE 150 MG/ML IM SUSP
150.0000 mg | Freq: Once | INTRAMUSCULAR | Status: AC
  Administered 2021-04-19: 150 mg via INTRAMUSCULAR

## 2021-04-19 MED ORDER — MEDROXYPROGESTERONE ACETATE 150 MG/ML IM SUSP: 150.0000 mg | Freq: Once | INTRAMUSCULAR | 2 refills | Status: DC

## 2021-04-19 NOTE — Progress Notes (Signed)
Subjective:  ? ? Shelby Kelly is a 26 y.o. female who presents for contraception counseling. The patient has no complaints today. The patient is not currently sexually active. Pertinent past medical history: none. She was on the pill this past year but feels like it has made her have menopausal symptoms and is interested in trying a different method.  ? ?Menstrual History: ?OB History   ? ? Gravida  ?2  ? Para  ?2  ? Term  ?2  ? Preterm  ?0  ? AB  ?0  ? Living  ?2  ?  ? ? SAB  ?0  ? IAB  ?0  ? Ectopic  ?0  ? Multiple  ?0  ? Live Births  ?2  ?   ?  ?  ?  ?No LMP recorded. (Menstrual status: Oral contraceptives). ?  ? ?The following portions of the patient's history were reviewed and updated as appropriate: allergies, current medications, past family history, past medical history, past social history, past surgical history, and problem list. ? ?Review of Systems ?Pertinent items are noted in HPI.  ? ?Objective:  ? ? No exam: not indicated for birth control counseling  ? ?Assessment:  ? ? 26 y.o., starting Depo-Provera injections, no contraindications.  ? ?Plan:  ? ? All questions answered.Reviewed all forms of birth control options available including abstinence; fertility period awareness methods; over the counter/barrier methods; hormonal contraceptive medication including pill, patch, ring, injection,contraceptive implant; hormonal and nonhormonal IUDs. Risks and benefits reviewed.  Questions were answered.  Information was given to patient to review.   First dose today. Follow up 3 months for next dose .  ? ?Doreene Burke, CNM  ?

## 2021-04-19 NOTE — Patient Instructions (Signed)
Contraceptive Injection Information ?A contraceptive injection is a shot that prevents pregnancy. It is also called a birth control shot. It is effective for 3 months (12-13 weeks). ?How does the injection work? ?The medicine progestin is injected into your body under the skin (subcutaneous) or into a muscle (intramuscular), usually in the upper arm or buttock. Progestin has similar effects as the hormone progesterone, which helps prepare the body for pregnancy and controls the monthly menstrual cycle. ?The progestin injection prevents pregnancy by: ?Stopping the ovaries from releasing eggs. ?Thickening cervical mucus to prevent sperm from entering the cervix. ?Thinning the lining of the uterus to prevent a fertilized egg from attaching to the uterus. ?What are the advantages of this form of birth control? ?Some advantages of this form of birth control are: ?It is highly effective at preventing pregnancy when used correctly. ?You do not need to do a daily action such as taking a pill. ?It can slow down the flow of heavy menstrual periods. ?It can control cramps and painful menstrual periods. ?It may temporarily stop your menstrual periods (amenorrhea). ?It lowers your risk for developing cancer of the uterus and pelvic inflammatory disease (PID). ?What are the disadvantages of this form of birth control? ?Some disadvantages of this form of birth control are: ?It can be associated with side effects, such as: ?Weight gain. ?Spotting or bleeding between menstrual periods. ?Breast tenderness. ?Headaches. ?Discomfort in the abdomen. ?Nervousness. ?Loss of bone density. ?It does not protect against sexually transmitted infections (STIs). ?You must visit your health care provider every 3 months (12-13 weeks) to receive the injection. ?The injections may be uncomfortable. ?It can take you up to a year to become pregnant after you stop the injections. ?Am I a good candidate for these injections? ?Your health care provider  can help you determine whether you are a good candidate for contraceptive injections. Make sure to discuss the possible side effects with your health care provider. You should not get the shot if you have a history of: ?Breast cancer. ?Heart attack or stroke. ?Liver disease. ?Liver cancer. ?Unexplained vaginal bleeding. ?If you have any of the following conditions, talk with your health care provider to see if a contraceptive implant is right for you: ?More than one risk factor for heart disease, such as high blood pressure, high cholesterol, diabetes, or cigarette smoking. ?History of blood clots. ?Risk factors for bone loss (osteoporosis). ?Follow these instructions at home: ?General instructions ?Take over-the-counter and prescription medicines only as told by your health care provider. ?Do not use any products that contain nicotine or tobacco. These products include cigarettes, chewing tobacco, and vaping devices, such as e-cigarettes. If you need help quitting, ask your health care provider. ?Do not rub or massage the injection site. ?Track your menstrual periods so you will know if they become irregular. ?Always use a condom to protect against STIs. ?Eat foods that are high in calcium and vitamin D, such as milk, cheese, and salmon. Doing this may help with any loss in bone density caused by the contraceptive injection. Ask your health care provider for dietary recommendations. ?Keep all follow-up visits. This is important. ?Summary ?A contraceptive injection is a shot that prevents pregnancy. It is effective for 3 months (12-13 weeks). ?This injection prevents pregnancy by thickening the cervical mucus, thinning the uterine wall, and stopping the ovaries from releasing eggs. ?This type of birth control is highly effective at preventing pregnancy. It can also slow down the flow of menstrual periods or stop them temporarily. ?  Side effects of this injection can include weight gain, headaches, bleeding between  menstrual periods, nervousness, and loss of bone density. ?Your health care provider can help you determine whether you are a good candidate for contraceptive injections. ?This information is not intended to replace advice given to you by your health care provider. Make sure you discuss any questions you have with your health care provider. ?Document Revised: 07/21/2019 Document Reviewed: 07/21/2019 ?Elsevier Patient Education ? 2022 Elsevier Inc. ? ?

## 2021-06-06 ENCOUNTER — Encounter: Payer: Self-pay | Admitting: Certified Nurse Midwife

## 2021-07-08 ENCOUNTER — Ambulatory Visit (INDEPENDENT_AMBULATORY_CARE_PROVIDER_SITE_OTHER): Payer: Medicaid Other | Admitting: Certified Nurse Midwife

## 2021-07-08 DIAGNOSIS — Z3042 Encounter for surveillance of injectable contraceptive: Secondary | ICD-10-CM

## 2021-07-08 MED ORDER — MEDROXYPROGESTERONE ACETATE 150 MG/ML IM SUSP
150.0000 mg | Freq: Once | INTRAMUSCULAR | Status: AC
  Administered 2021-07-08: 150 mg via INTRAMUSCULAR

## 2021-07-08 NOTE — Progress Notes (Unsigned)
Date last pap: 02/14/2018 Last Depo-Provera: 04/19/2021 Side Effects if any: n/a. Serum HCG indicated? N/a. Depo-Provera 150 mg IM given by: Beverely Pace CMA. Next appointment due 09/23/21-10/08/21.

## 2021-09-23 ENCOUNTER — Ambulatory Visit (INDEPENDENT_AMBULATORY_CARE_PROVIDER_SITE_OTHER): Payer: Self-pay | Admitting: Certified Nurse Midwife

## 2021-09-23 VITALS — BP 108/70 | HR 85 | Ht 59.0 in | Wt 100.3 lb

## 2021-09-23 DIAGNOSIS — Z3042 Encounter for surveillance of injectable contraceptive: Secondary | ICD-10-CM

## 2021-09-23 MED ORDER — MEDROXYPROGESTERONE ACETATE 150 MG/ML IM SUSP
150.0000 mg | Freq: Once | INTRAMUSCULAR | Status: AC
  Administered 2021-09-23: 150 mg via INTRAMUSCULAR

## 2021-09-23 NOTE — Progress Notes (Signed)
Date last pap: 02/14/2018 Last Depo-Provera: 07/08/2021.. Side Effects if any: N/A. Serum HCG indicated? N?A. Depo-Provera 150 mg IM given by: Beverely Pace CMA. Next appointment due NOV17-DEC1.

## 2021-12-08 ENCOUNTER — Ambulatory Visit: Payer: Self-pay

## 2021-12-08 ENCOUNTER — Ambulatory Visit (INDEPENDENT_AMBULATORY_CARE_PROVIDER_SITE_OTHER): Payer: Self-pay

## 2021-12-08 VITALS — BP 112/71 | HR 92 | Resp 16 | Ht 59.0 in | Wt 107.0 lb

## 2021-12-08 DIAGNOSIS — Z3042 Encounter for surveillance of injectable contraceptive: Secondary | ICD-10-CM

## 2021-12-08 MED ORDER — MEDROXYPROGESTERONE ACETATE 150 MG/ML IM SUSP
150.0000 mg | Freq: Once | INTRAMUSCULAR | Status: AC
  Administered 2021-12-08: 150 mg via INTRAMUSCULAR

## 2021-12-08 NOTE — Patient Instructions (Incomplete)

## 2021-12-08 NOTE — Progress Notes (Addendum)
    Nurse Visit Note  Subjective:    Patient ID: Shelby Kelly, female    DOB: 09-Jun-1995, 26 y.o.   MRN: [id number]  HPI  Patient is a 26 y.o. G44P2002 female who presents for surveillance of depo provera injection. She reports today that she has been getting acne on different area of her body. She also has been having headache, she is irritable and having mood swings.  Date last pap: 02/14/2018 Last Depo-Provera: 09/23/2021 Side Effects if any: N/A. Serum HCG indicated? N/A. Depo-Provera 150 mg IM given by: Santiago Bumpers, CMA Next appointment due:  Feb.1 - Feb.15         The following portions of the patient's history were reviewed and updated as appropriate: allergies, current medications, past family history, past medical history, past social history, past surgical history, and problem list.  Review of Systems Pertinent items are noted in HPI.   Objective:   Blood pressure 112/71, pulse 92, resp. rate 16, height 4\' 11"  (1.499 m), weight 107 lb (48.5 kg). Body mass index is 21.61 kg/m. General appearance: alert, cooperative, and no distress    Assessment:   1. Encounter for management and injection of depo-Provera      Plan:   Follow up in 3 months for next Depo Provera Injection (02/01-02/15).    04-15[birth year]1975, CMA [facility]

## 2022-02-27 ENCOUNTER — Ambulatory Visit (INDEPENDENT_AMBULATORY_CARE_PROVIDER_SITE_OTHER): Payer: Self-pay

## 2022-02-27 VITALS — BP 119/81 | HR 78 | Ht 59.0 in | Wt 114.5 lb

## 2022-02-27 DIAGNOSIS — Z3042 Encounter for surveillance of injectable contraceptive: Secondary | ICD-10-CM

## 2022-02-27 MED ORDER — MEDROXYPROGESTERONE ACETATE 150 MG/ML IM SUSP
150.0000 mg | Freq: Once | INTRAMUSCULAR | Status: AC
  Administered 2022-02-27: 150 mg via INTRAMUSCULAR

## 2022-02-27 NOTE — Progress Notes (Signed)
    NURSE VISIT NOTE  Subjective:    Patient ID: Shelby Kelly, female    DOB: 1995/04/08, 27 y.o.   MRN: [id number]  HPI  Patient is a 27 y.o. G42P2002 female who presents for depo provera injection.   Objective:    BP 119/81   Pulse 78   Ht 4\' 11"  (1.499 m)   Wt 114 lb 8 oz (51.9 kg)   BMI 23.13 kg/m   Last Annual: 10/13/20. Last pap: 02/22/2018, due. Last Depo-Provera: 12/08/2021. Side Effects if any: none. Serum HCG indicated? No . Depo-Provera 150 mg IM given by: Douglass Rivers, CMA. Site: Right Upper Outer Quandrant    Assessment:   1. Encounter for management and injection of depo-Provera      Plan:   Next appointment due between 05/16/22 and 05/30/22.    Marykay Lex, CMA

## 2022-05-17 ENCOUNTER — Ambulatory Visit (INDEPENDENT_AMBULATORY_CARE_PROVIDER_SITE_OTHER): Payer: Self-pay

## 2022-05-17 VITALS — BP 120/80 | Ht 59.0 in | Wt 117.0 lb

## 2022-05-17 DIAGNOSIS — Z3042 Encounter for surveillance of injectable contraceptive: Secondary | ICD-10-CM

## 2022-05-17 MED ORDER — MEDROXYPROGESTERONE ACETATE 150 MG/ML IM SUSP
150.0000 mg | Freq: Once | INTRAMUSCULAR | Status: AC
  Administered 2022-05-17: 150 mg via INTRAMUSCULAR

## 2022-05-17 NOTE — Progress Notes (Signed)
    NURSE VISIT NOTE  Subjective:    Patient ID: Shelby Kelly, female    DOB: 11/16/1995, 27 y.o.   MRN: [id number]  HPI  Patient is a 27 y.o. G13P2002 female who presents for depo provera injection.   Objective:    BP 120/80   Ht  (1.499 m)   Wt 117 lb (53.1 kg)   BMI 23.63 kg/m   Last Annual: 04/19/21. Last pap: 02/22/18. Last Depo-Provera: 02/27/22. Side Effects if any: none. Serum HCG indicated? No . Depo-Provera 150 mg IM given by: Cornelius Moras, CMA. Site: Right Ventrogluteal  Lab Review   VISIT ONLY@  Assessment:   1. Encounter for surveillance of injectable contraceptive      Plan:  Schedule annual,  Next depo appointment due between July 10 and July 24.    Cornelius Moras, CMA

## 2022-05-17 NOTE — Patient Instructions (Signed)
Contraceptive Injection A contraceptive injection is a shot that prevents pregnancy. It is also called a birth control shot. The shot contains the hormone progestin, which prevents pregnancy by: Stopping the ovaries from releasing eggs. Thickening cervical mucus to prevent sperm from entering the cervix. Thinning the lining of the uterus to prevent a fertilized egg from attaching to the uterus. Contraceptive injections are given under the skin (subcutaneous) or into a muscle (intramuscular). For these shots to work, you must get one of them every 3 months (12-13 weeks) from a health care provider. Tell a health care provider about: Any allergies you have. All medicines you are taking, including vitamins, herbs, eye drops, creams, and over-the-counter medicines. Any blood disorders you have. Any medical conditions you have. Whether you are pregnant or may be pregnant. What are the risks? Generally, this is a safe procedure. However, problems may occur, including: Mood changes or depression. Loss of bone density (osteoporosis) after long-term use. Blood clots. This is rare. Higher risk of an egg being fertilized outside your uterus (ectopic pregnancy).This is rare. What happens before the procedure? Your health care provider may do a routine physical exam. You may have a test to make sure you are not pregnant. What happens during the procedure?  The area where the shot will be given will be cleaned and sanitized with alcohol. A needle will be inserted into a muscle in your upper arm or buttock, or into the skin of your thigh or abdomen. The needle will be attached to a syringe with the medicine inside of it. The medicine will be pushed through the syringe and injected into your body. A small bandage (dressing) may be placed over the injection site. What can I expect after the procedure? After the procedure, it is common to have: Soreness around the injection site for a couple of  days. Irregular menstrual bleeding. Weight gain. Breast tenderness. Headaches. Discomfort in your abdomen. Ask your health care provider whether you need to use an added method of birth control (backup contraception), such as a condom, sponge, or spermicide. If the first shot is given 1-7 days after the start of your last menstrual period, you will not need backup contraception. If the first shot is given at any other time during your menstrual cycle, you should avoid having sex. If you do have sex, you will need to use backup contraception for 7 days after you receive the shot. Follow these instructions at home: General instructions Take over-the-counter and prescription medicines only as told by your health care provider. Do not rub or massage the injection site. Track your menstrual periods so you will know if they become irregular. Always use a condom to protect against sexually transmitted infections (STIs). Make sure you schedule an appointment in time for your next shot and mark it on your calendar. You must get an injection every 3 months (12-13 weeks) to prevent pregnancy. Lifestyle Do not use any products that contain nicotine or tobacco. These products include cigarettes, chewing tobacco, and vaping devices, such as e-cigarettes. If you need help quitting, ask your health care provider. Eat foods that are high in calcium and vitamin D, such as milk, cheese, and salmon. Doing this may help with any loss in bone density caused by the contraceptive injection. Ask your health care provider for dietary recommendations. Contact a health care provider if you: Have nausea or vomiting. Have abnormal vaginal discharge or bleeding. Miss a menstrual period or think you might be pregnant. Experience mood changes   or depression. Feel dizzy or light-headed. Have leg pain. Get help right away if you: Have chest pain or cough up blood. Have shortness of breath. Have a severe headache that does  not go away. Have numbness in any part of your body. Have slurred speech or vision problems. Have vaginal bleeding that is abnormally heavy or does not stop, or you have severe pain in your abdomen. Have depression that does not get better with treatment. If you ever feel like you may hurt yourself or others, or have thoughts about taking your own life, get help right away. Go to your nearest emergency department or: Call your local emergency services (911 in the [place] Call a suicide crisis helpline, such as the National Suicide Prevention Lifeline at 1-800-273-8255 or 988 in the [place] This is open 24 hours a day in the [place] Text the Crisis Text Line at 741741 (in the [place] Summary A contraceptive injection is a shot that prevents pregnancy. It is also called the birth control shot. The shot is given under the skin (subcutaneous) or into a muscle (intramuscular). After this procedure, it is common to have soreness around the injection site for a couple of days. To prevent pregnancy, the shot must be given by a health care provider every 3 months (12-13 weeks). After you have the shot, ask your health care provider whether you need to use an added method of birth control (backup contraception), such as a condom, sponge, or spermicide. This information is not intended to replace advice given to you by your health care provider. Make sure you discuss any questions you have with your health care provider. Document Revised: 08/04/2020 Document Reviewed: 07/21/2019 Elsevier Patient Education  2023 Elsevier Inc.  

## 2022-08-07 ENCOUNTER — Ambulatory Visit: Payer: Medicaid Other

## 2022-08-07 NOTE — Progress Notes (Deleted)
    NURSE VISIT NOTE  Subjective:    Patient ID: Shelby Kelly, female    DOB: 21-Nov-1995, 27 y.o.   MRN: [id number]  HPI  Patient is a 27 y.o. G58P2002 female who presents for depo provera injection.   Objective:    There were no vitals taken for this visit.  Last Annual: ***. Last pap: ***. Last Depo-Provera: ***. Side Effects if any: {NONE:21772}***. Serum HCG indicated? {YES/NO:21197}. Depo-Provera 150 mg IM given by: {AOB Clinical YHCWC:37628}. Site: {AOB INJ [id number]  Lab Review  @THIS  VISIT ONLY@  Assessment:   No diagnosis found.   Plan:   Next appointment due between *** and ***.    Cornelius Moras, CMA

## 2022-08-09 ENCOUNTER — Ambulatory Visit: Payer: Medicaid Other

## 2022-08-17 ENCOUNTER — Ambulatory Visit: Payer: Medicaid Other | Admitting: Certified Nurse Midwife

## 2022-08-31 ENCOUNTER — Other Ambulatory Visit (HOSPITAL_COMMUNITY)
Admission: RE | Admit: 2022-08-31 | Discharge: 2022-08-31 | Disposition: A | Discharge status: Home / Self Care | Payer: Medicaid Other | Source: Ambulatory Visit | Attending: Certified Nurse Midwife | Admitting: Certified Nurse Midwife

## 2022-08-31 ENCOUNTER — Ambulatory Visit (INDEPENDENT_AMBULATORY_CARE_PROVIDER_SITE_OTHER): Payer: Medicaid Other

## 2022-08-31 VITALS — BP 104/67 | HR 81 | Ht 59.0 in | Wt 121.0 lb

## 2022-08-31 DIAGNOSIS — Z01419 Encounter for gynecological examination (general) (routine) without abnormal findings: Secondary | ICD-10-CM

## 2022-08-31 DIAGNOSIS — Z113 Encounter for screening for infections with a predominantly sexual mode of transmission: Secondary | ICD-10-CM

## 2022-08-31 DIAGNOSIS — Z3202 Encounter for pregnancy test, result negative: Secondary | ICD-10-CM | POA: Diagnosis not present

## 2022-08-31 DIAGNOSIS — Z3042 Encounter for surveillance of injectable contraceptive: Secondary | ICD-10-CM

## 2022-08-31 LAB — POCT URINE PREGNANCY: Preg Test, Ur: NEGATIVE

## 2022-08-31 MED ORDER — MEDROXYPROGESTERONE ACETATE 150 MG/ML IM SUSY
150.0000 mg | PREFILLED_SYRINGE | Freq: Once | INTRAMUSCULAR | Status: AC
  Administered 2022-08-31: 150 mg via INTRAMUSCULAR

## 2022-08-31 NOTE — Assessment & Plan Note (Addendum)
-   Reviewed health maintenance topics as documented below. - Routine cervical cancer screening pap smear collected today. - STI screening collected. - Thyroid hormone studies ordered given vasomotor symptoms. - Strongly desires to continue with Depo Provera. Discussed that her current vasomotor symptoms may be a side effect of the contraception but the only way to know for sure would be for her to discontinue or try another form of contraception. We reviewed other options including OCPs (has used in the past, doesn't do well with a daily pill), IUD, or implant. She does not wish to switch or discontinue Depo at this time.  - Recommended establishing with a PCP now that she has Medicaid for follow up on her eye concerns.
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
## 2022-11-15 NOTE — Progress Notes (Unsigned)
    NURSE VISIT NOTE  Subjective:    Patient ID: Shelby Kelly, female    DOB: 07/20/1995, 27 y.o.   MRN: [id number]  HPI  Patient is a 27 y.o. G43P2002 female who presents for depo provera injection.   Objective:    There were no vitals taken for this visit.  Last Annual: 08/31/22. Last pap: 08/31/22. Last Depo-Provera: 08/31/22. Side Effects if any: none. Serum HCG indicated? No . Depo-Provera 150 mg IM given by: Sheliah Hatch, CMA. Site: Left Ventrogluteal  Lab Review    Assessment:   No diagnosis found.   Plan:   Next appointment due between 02/01/23 and 02/15/23.    Fonda Kinder, CMA

## 2022-11-16 ENCOUNTER — Ambulatory Visit (INDEPENDENT_AMBULATORY_CARE_PROVIDER_SITE_OTHER): Payer: Medicaid Other

## 2022-11-16 ENCOUNTER — Ambulatory Visit: Payer: Medicaid Other

## 2022-11-16 VITALS — BP 120/82 | HR 79 | Ht 59.0 in | Wt 120.1 lb

## 2022-11-16 DIAGNOSIS — Z3042 Encounter for surveillance of injectable contraceptive: Secondary | ICD-10-CM

## 2022-11-16 MED ORDER — MEDROXYPROGESTERONE ACETATE 150 MG/ML IM SUSP
150.0000 mg | Freq: Once | INTRAMUSCULAR | Status: AC
Start: 2022-11-16 — End: 2022-11-16
  Administered 2022-11-16: 150 mg via INTRAMUSCULAR

## 2022-11-16 NOTE — Patient Instructions (Signed)

## 2023-02-05 ENCOUNTER — Ambulatory Visit: Payer: Medicaid Other

## 2023-02-12 ENCOUNTER — Ambulatory Visit: Payer: Medicaid Other

## 2023-02-21 ENCOUNTER — Ambulatory Visit: Payer: Medicaid Other

## 2023-02-21 VITALS — BP 108/76 | HR 98 | Ht 59.0 in | Wt 119.0 lb

## 2023-02-21 DIAGNOSIS — Z3042 Encounter for surveillance of injectable contraceptive: Secondary | ICD-10-CM

## 2023-02-21 DIAGNOSIS — Z3202 Encounter for pregnancy test, result negative: Secondary | ICD-10-CM | POA: Diagnosis not present

## 2023-02-21 LAB — POCT URINE PREGNANCY: Preg Test, Ur: NEGATIVE

## 2023-02-21 MED ORDER — MEDROXYPROGESTERONE ACETATE 150 MG/ML IM SUSY
150.0000 mg | PREFILLED_SYRINGE | Freq: Once | INTRAMUSCULAR | Status: AC
Start: 2023-02-21 — End: 2023-02-21
  Administered 2023-02-21: 150 mg via INTRAMUSCULAR

## 2023-02-21 NOTE — Progress Notes (Addendum)
    NURSE VISIT NOTE  Subjective:    Patient ID: August Albino, female    DOB: Feb 19, 1995, 28 y.o.   MRN: [id number]  HPI  Patient is a 28 y.o. G63P2002 female who presents for depo provera injection.   Objective:    BP 108/76   Pulse 98   Wt 119 lb (54 kg)   BMI 24.04 kg/m   Last Annual: 08/31/22. Last pap: 08/31/2022. Last Depo-Provera: 11/16/22. Side Effects if any: none. Serum HCG indicated? Yes . Depo-Provera 150 mg IM given by: Beverely Pace, CMA. Site: Right Upper Outer Quandrant    Assessment:   1. Encounter for management and injection of depo-Provera      Plan:   Next appointment due between 04/16-30    Loney Laurence, CMA

## 2023-05-16 ENCOUNTER — Ambulatory Visit (INDEPENDENT_AMBULATORY_CARE_PROVIDER_SITE_OTHER): Payer: Medicaid Other

## 2023-05-16 VITALS — BP 114/80 | HR 81 | Ht 59.0 in | Wt 118.9 lb

## 2023-05-16 DIAGNOSIS — Z3042 Encounter for surveillance of injectable contraceptive: Secondary | ICD-10-CM | POA: Diagnosis not present

## 2023-05-16 MED ORDER — MEDROXYPROGESTERONE ACETATE 150 MG/ML IM SUSP
150.0000 mg | Freq: Once | INTRAMUSCULAR | Status: AC
Start: 2023-05-16 — End: 2023-05-16
  Administered 2023-05-16: 150 mg via INTRAMUSCULAR

## 2023-05-16 NOTE — Progress Notes (Signed)
    NURSE VISIT NOTE  Subjective:    Patient ID: Arcola Kocher, female    DOB: Jun 11, 1995, 28 y.o.   MRN: [id number]  HPI  Patient is a 28 y.o. G32P2002 female who presents for depo provera  injection.   Objective:    BP 114/80   Pulse 81   Ht 4\' 11"  (1.499 m)   Wt 118 lb 14.4 oz (53.9 kg)   BMI 24.01 kg/m   Last Annual: 08/31/2022. Last pap: 08/31/2022. Last Depo-Provera : 02/21/2023. Side Effects if any: none\. Serum HCG indicated? No . Depo-Provera  150 mg IM given by: Venetta Gill, CMA. Site: Right Ventrogluteal  Lab Review    Assessment:   1. Encounter for surveillance of injectable contraceptive      Plan:   Next appointment due between 7/9 and 08/15/2023.    Inga Manges, CMA

## 2023-05-16 NOTE — Patient Instructions (Signed)

## 2023-06-05 ENCOUNTER — Ambulatory Visit (INDEPENDENT_AMBULATORY_CARE_PROVIDER_SITE_OTHER): Admitting: Obstetrics & Gynecology

## 2023-06-05 ENCOUNTER — Encounter: Payer: Self-pay | Admitting: Obstetrics & Gynecology

## 2023-06-05 VITALS — BP 116/80 | HR 82 | Ht 59.0 in | Wt 120.7 lb

## 2023-06-05 DIAGNOSIS — N898 Other specified noninflammatory disorders of vagina: Secondary | ICD-10-CM

## 2023-06-05 DIAGNOSIS — R61 Generalized hyperhidrosis: Secondary | ICD-10-CM

## 2023-06-05 NOTE — Progress Notes (Signed)
    GYNECOLOGY PROGRESS NOTE  Subjective:    Patient ID: Shelby Kelly, female    DOB: 1995-02-05, 28 y.o.   MRN: [id number]  HPI  Patient is a 28 y.o. [id number] here with the complaint of 3-5 year h/o night sweats, heat intolerance with extra sweating, vaginal dryness. She has been using depo provera  for 3 years and has amenorrhea. She used OCPs prior to this but reports not remembering to take her pills every day. She has concerns about IUDs.   The following portions of the patient's history were reviewed and updated as appropriate: allergies, current medications, past family history, past medical history, past social history, past surgical history, and problem list.  Review of Systems Pertinent items are noted in HPI.  Pap UTD  Objective:   Blood pressure 116/80, pulse 82, height 4\' 11"  (1.499 m), weight 120 lb 11.2 oz (54.7 kg). Body mass index is 24.38 kg/m. General appearance: alert Well nourished, well hydrated female, no apparent distress She is ambulating and conversing normally.    Assessment:   Heat intolerance, night sweats and vaginal dryness  Plan:   Shelby Kelly that she would benefit from stopping depo provera  and using a Mirena. I will check FSH, vit D, and per her request, ferritin level.

## 2023-06-06 LAB — TSH+FREE T4
Free T4: 1.18 ng/dL (ref 0.82–1.77)
TSH: 0.917 u[IU]/mL (ref 0.450–4.500)

## 2023-06-06 LAB — FSH/LH
FSH: 5.7 m[IU]/mL
LH: 1.5 m[IU]/mL

## 2023-06-06 LAB — FERRITIN: Ferritin: 264 ng/mL — ABNORMAL HIGH (ref 15–150)

## 2023-06-06 LAB — VITAMIN D 25 HYDROXY (VIT D DEFICIENCY, FRACTURES): Vit D, 25-Hydroxy: 33.6 ng/mL (ref 30.0–100.0)

## 2023-08-08 ENCOUNTER — Ambulatory Visit

## 2023-08-08 VITALS — BP 111/70 | HR 94 | Ht 59.0 in | Wt 120.8 lb

## 2023-08-08 DIAGNOSIS — Z3042 Encounter for surveillance of injectable contraceptive: Secondary | ICD-10-CM | POA: Diagnosis not present

## 2023-08-08 MED ORDER — MEDROXYPROGESTERONE ACETATE 150 MG/ML IM SUSP
150.0000 mg | Freq: Once | INTRAMUSCULAR | Status: AC
Start: 2023-08-08 — End: 2023-08-08
  Administered 2023-08-08: 150 mg via INTRAMUSCULAR

## 2023-08-08 NOTE — Patient Instructions (Signed)

## 2023-08-08 NOTE — Progress Notes (Signed)
    NURSE VISIT NOTE  Subjective:    Patient ID: Shelby Kelly, female    DOB: 1995-05-20, 28 y.o.   MRN: [id number]  HPI  Patient is a 28 y.o. G20P2002 female who presents for depo provera  injection.   Objective:    BP 111/70   Pulse 94   Ht 4' 11 (1.499 m)   Wt 120 lb 12.8 oz (54.8 kg)   BMI 24.40 kg/m   Last Annual: 08/31/2022. Last pap: 08/31/2022. Last Depo-Provera : 05/16/2023. Side Effects if any: none. Serum HCG indicated? No . Depo-Provera  150 mg IM given by: Rollo Maxin, CMA. Site: Right Ventrogluteal  Lab Review  No results found for any visits on 08/08/23.  Assessment:   1. Encounter for surveillance of injectable contraceptive      Plan:   Next appointment due between 10/24/23 and 11/07/23.    Rollo JINNY Maxin, CMA
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
## 2023-09-10 NOTE — Patient Instructions (Signed)
 Preventive Care 48-26 Years Old, Female  Preventive care refers to lifestyle choices and visits with your health care provider that can promote health and wellness. Preventive care visits are also called wellness exams. What can I expect for my preventive care visit? Counseling During your preventive care visit, your health care provider may ask about your: Medical history, including: Past medical problems. Family medical history. Pregnancy history. Current health, including: Menstrual cycle. Method of birth control. Emotional well-being. Home life and relationship well-being. Sexual activity and sexual health. Lifestyle, including: Alcohol, nicotine  or tobacco, and drug use. Access to firearms. Diet, exercise, and sleep habits. Work and work Astronomer. Sunscreen use. Safety issues such as seatbelt and bike helmet use. Physical exam Your health care provider may check your: Height and weight. These may be used to calculate your BMI (body mass index). BMI is a measurement that tells if you are at a healthy weight. Waist circumference. This measures the distance around your waistline. This measurement also tells if you are at a healthy weight and may help predict your risk of certain diseases, such as type 2 diabetes and high blood pressure. Heart rate and blood pressure. Body temperature. Skin for abnormal spots. What immunizations do I need?  Vaccines are usually given at various ages, according to a schedule. Your health care provider will recommend vaccines for you based on your age, medical history, and lifestyle or other factors, such as travel or where you work. What tests do I need? Screening Your health care provider may recommend screening tests for certain conditions. This may include: Pelvic exam and Pap test. Lipid and cholesterol levels. Diabetes screening. This is done by checking your blood sugar (glucose) after you have not eaten for a while  (fasting). Hepatitis B test. Hepatitis C test. HIV (human immunodeficiency virus) test. STI (sexually transmitted infection) testing, if you are at risk. BRCA-related cancer screening. This may be done if you have a family history of breast, ovarian, tubal, or peritoneal cancers. Talk with your health care provider about your test results, treatment options, and if necessary, the need for more tests. Follow these instructions at home: Eating and drinking  Eat a healthy diet that includes fresh fruits and vegetables, whole grains, lean protein, and low-fat dairy products. Take vitamin and mineral supplements as recommended by your health care provider. Do not drink alcohol if: Your health care provider tells you not to drink. You are pregnant, may be pregnant, or are planning to become pregnant. If you drink alcohol: Limit how much you have to 0-1 drink a day. Know how much alcohol is in your drink. In the [place] one drink equals one 12 oz bottle of beer (355 mL), one 5 oz glass of wine (148 mL), or one 1 oz glass of hard liquor (44 mL). Lifestyle Brush your teeth every morning and night with fluoride toothpaste. Floss one time each day. Exercise for at least 30 minutes 5 or more days each week. Do not use any products that contain nicotine  or tobacco. These products include cigarettes, chewing tobacco, and vaping devices, such as e-cigarettes. If you need help quitting, ask your health care provider. Do not use drugs. If you are sexually active, practice safe sex. Use a condom or other form of protection to prevent STIs. If you do not wish to become pregnant, use a form of birth control. If you plan to become pregnant, see your health care provider for a prepregnancy visit. Find healthy ways to manage stress, such as:  Meditation, yoga, or listening to music. Journaling. Talking to a trusted person. Spending time with friends and family. Minimize exposure to UV radiation to reduce your  risk of skin cancer. Safety Always wear your seat belt while driving or riding in a vehicle. Do not drive: If you have been drinking alcohol. Do not ride with someone who has been drinking. If you have been using any mind-altering substances or drugs. While texting. When you are tired or distracted. Wear a helmet and other protective equipment during sports activities. If you have firearms in your house, make sure you follow all gun safety procedures. Seek help if you have been physically or sexually abused. What's next? Go to your health care provider once a year for an annual wellness visit. Ask your health care provider how often you should have your eyes and teeth checked. Stay up to date on all vaccines. This information is not intended to replace advice given to you by your health care provider. Make sure you discuss any questions you have with your health care provider. Document Revised: 07/07/2020 Document Reviewed: 07/07/2020 Elsevier Patient Education  2024 Else[employer] Inc.  How to Do a Breast Self-Exam Doing breast self-exams can help you stay healthy. They're one way to know what's normal for your breasts. They can help you catch a problem while it's still small and can be treated. You need to: Check your breasts often. Tell your doctor about any changes. You should do breast self-exams even if you have breast implants. What you need: A mirror. A well-lit room. A pillow or other soft object. How to do a breast self-exam Look for changes  Take off all the clothes above your waist. Stand in front of a mirror in a room with good lighting. Put your hands down at your sides. Compare your breasts in the mirror. Look for difference between them, such as: Differences in shape. Differences in size. Wrinkles, dips, and bumps in one breast and not the other. Look at each breast for skin changes, such as: Redness. Scaly spots. Spots where your skin is thicker. Dimpling. Open  sores. Look for changes in your nipples, such as: Fluid coming out of a nipple. Fluid around a nipple. Bleeding. Dimpling. Redness. A nipple that looks pushed in or that has changed position. Feel for changes Lie on your back. Feel each breast. To do this: Pick a breast to feel. Place a pillow under the shoulder closest to that breast. Put the arm closest to that breast behind your head. Feel the breast using the hand of your other arm. Use the pads of your three middle fingers to make small circles starting near the nipple. Use light, medium, and firm pressure. Keep making circles, moving down over the breast. Stop when you feel your ribs. Start making circles with your fingers again, this time going up until you reach your collarbone. Then, make circles out across your breast and into your armpit area. Squeeze your nipple. Check for fluid and lumps. Do these steps again to check your other breast. Sit or stand in the tub or shower. With soapy water on your skin, feel each breast the same way you did when you were lying down. Write down what you find Writing down what you find can help you keep track of what you want to tell your doctor. Write down: What's normal for each breast. Any changes you find. Write down: The kind of change. If your breast feels tender or painful. Any lump you  find. Write down its size and where it is. When you last had your period. General tips If you're breastfeeding, the best time to check your breasts is after you feed your baby or after you use a breast pump. If you get a period, the best time to check your breasts is 5-7 days after your period ends. With time, you'll get more used to doing the self-exam. You'll also start to know if there are changes in your breasts. Contact a doctor if: You see a change in the shape or size of your breasts or nipples. You see a change in the skin of your breast or nipples. You have fluid coming from your nipples  that isn't normal. You find a new lump or thick area. You have breast pain. You have any concerns about your breast health. This information is not intended to replace advice given to you by your health care provider. Make sure you discuss any questions you have with your health care provider. Document Revised: 03/21/2023 Document Reviewed: 03/21/2023 Elsevier Patient Education  2025 [employer].

## 2023-09-11 ENCOUNTER — Ambulatory Visit (INDEPENDENT_AMBULATORY_CARE_PROVIDER_SITE_OTHER): Admitting: Certified Nurse Midwife

## 2023-09-11 VITALS — BP 113/68 | HR 88 | Resp 16 | Ht 59.0 in | Wt 122.7 lb

## 2023-09-11 DIAGNOSIS — Z01419 Encounter for gynecological examination (general) (routine) without abnormal findings: Secondary | ICD-10-CM

## 2023-09-11 NOTE — Progress Notes (Signed)
 I personally supervised this student, reviewed all documentation, reviewed and am in agreement with the plan of care, and was present for the physical portion of the exam.
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
## 2023-10-31 ENCOUNTER — Ambulatory Visit

## 2023-10-31 VITALS — BP 130/81 | HR 71 | Ht 59.0 in | Wt 125.4 lb

## 2023-10-31 DIAGNOSIS — Z3042 Encounter for surveillance of injectable contraceptive: Secondary | ICD-10-CM

## 2023-10-31 MED ORDER — MEDROXYPROGESTERONE ACETATE 150 MG/ML IM SUSP
150.0000 mg | Freq: Once | INTRAMUSCULAR | Status: AC
Start: 2023-10-31 — End: 2023-10-31
  Administered 2023-10-31: 150 mg via INTRAMUSCULAR

## 2023-10-31 NOTE — Progress Notes (Signed)
    NURSE VISIT NOTE  Subjective:    Patient ID: Shelby Kelly, female    DOB: 1995/02/14, 28 y.o.   MRN: [id number]  HPI  Patient is a 28 y.o. G35P2002 female who presents for depo provera  injection.   Objective:    BP 130/81   Pulse 71   Ht 4' 11 (1.499 m)   Wt 125 lb 6.4 oz (56.9 kg)   BMI 25.33 kg/m   Last Annual: 09/11/23. Last pap: 08/31/22. Last Depo-Provera : 08/08/23. Side Effects if any: n/a. Serum HCG indicated? No . Depo-Provera  150 mg IM given by: Waddell Maxim, CMA. Site: Right Upper Outer Quandrant   Assessment:   1. Encounter for Depo-Provera  contraception      Plan:   Next appointment due between 01/16/24 and 01/30/24.    Waddell JONELLE Maxim, CMA

## 2024-01-21 ENCOUNTER — Ambulatory Visit

## 2024-01-21 VITALS — BP 113/75 | HR 105 | Resp 16 | Ht 59.0 in | Wt 125.0 lb

## 2024-01-21 DIAGNOSIS — Z3042 Encounter for surveillance of injectable contraceptive: Secondary | ICD-10-CM

## 2024-01-21 MED ORDER — MEDROXYPROGESTERONE ACETATE 150 MG/ML IM SUSP: 150.0000 mg | Freq: Once | INTRAMUSCULAR | 2 refills | Status: DC

## 2024-01-21 MED ORDER — MEDROXYPROGESTERONE ACETATE 150 MG/ML IM SUSP
150.0000 mg | Freq: Once | INTRAMUSCULAR | Status: AC
  Administered 2024-01-21: 150 mg via INTRAMUSCULAR

## 2024-01-21 NOTE — Patient Instructions (Signed)

## 2024-01-21 NOTE — Progress Notes (Signed)
" ° ° °  NURSE VISIT NOTE  Subjective:    Patient ID: Shelby Kelly, female    DOB: 1995/03/05, 28 y.o.   MRN: [id number]  HPI  Patient is a 28 y.o. G66P2002 female who presents for depo provera  injection.   Objective:    BP 113/75   Pulse (!) 105   Resp 16   Ht 4' 11 (1.499 m)   Wt 125 lb (56.7 kg)   BMI 25.25 kg/m   Last Annual: 09/11/2023. Last pap: 08/31/2022. Last Depo-Provera : 10/31/2023 Side Effects if any: She has been having hot flashes. She reports that she has every symptom that someone in menopause would have. Serum HCG indicated? No . Depo-Provera  150 mg IM given by: Camelia Fetters, CMA. Site: Left Upper Outer Quandrant  Lab Review  No results found for any visits on 01/21/24.  Assessment:   1. Encounter for management and injection of depo-Provera       Plan:   Next appointment due between 04/08/2023 and 04/22/2023.    Camelia Fetters, CMA [facility]

## 2024-01-21 NOTE — Addendum Note (Signed)
 Addended by: KIZZIE CAMELIA CROME on: 01/21/2024 05:07 PM   Modules accepted: Orders

## 2024-04-14 ENCOUNTER — Ambulatory Visit
